# Patient Record
Sex: Female | Born: 2000 | Race: White | Hispanic: No | Marital: Single | Smoking: Never smoker
Health system: Southern US, Community
[De-identification: ages and names within clinical notes are randomized; demographics above are authoritative.]

## PROBLEM LIST (undated history)

## (undated) DIAGNOSIS — O139 Gestational [pregnancy-induced] hypertension without significant proteinuria, unspecified trimester: Secondary | ICD-10-CM

## (undated) DIAGNOSIS — F909 Attention-deficit hyperactivity disorder, unspecified type: Secondary | ICD-10-CM

## (undated) DIAGNOSIS — F913 Oppositional defiant disorder: Secondary | ICD-10-CM

## (undated) DIAGNOSIS — F419 Anxiety disorder, unspecified: Secondary | ICD-10-CM

## (undated) HISTORY — DX: Gestational (pregnancy-induced) hypertension without significant proteinuria, unspecified trimester: O13.9

## (undated) HISTORY — DX: Oppositional defiant disorder: F91.3

## (undated) HISTORY — PX: MOUTH SURGERY: SHX715

## (undated) HISTORY — DX: Anxiety disorder, unspecified: F41.9

## (undated) HISTORY — DX: Attention-deficit hyperactivity disorder, unspecified type: F90.9

---
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
## 2021-04-09 ENCOUNTER — Emergency Department (HOSPITAL_COMMUNITY): Payer: 59

## 2021-04-09 ENCOUNTER — Emergency Department (HOSPITAL_COMMUNITY)
Admission: EM | Admit: 2021-04-09 | Discharge: 2021-04-09 | Disposition: A | Discharge status: Home / Self Care | Payer: 59 | Attending: Emergency Medicine | Admitting: Emergency Medicine

## 2021-04-09 DIAGNOSIS — D72829 Elevated white blood cell count, unspecified: Secondary | ICD-10-CM | POA: Diagnosis not present

## 2021-04-09 DIAGNOSIS — Z20822 Contact with and (suspected) exposure to covid-19: Secondary | ICD-10-CM | POA: Insufficient documentation

## 2021-04-09 DIAGNOSIS — N898 Other specified noninflammatory disorders of vagina: Secondary | ICD-10-CM | POA: Insufficient documentation

## 2021-04-09 DIAGNOSIS — R1084 Generalized abdominal pain: Secondary | ICD-10-CM

## 2021-04-09 DIAGNOSIS — R102 Pelvic and perineal pain: Secondary | ICD-10-CM | POA: Diagnosis not present

## 2021-04-09 LAB — URINALYSIS, ROUTINE W REFLEX MICROSCOPIC
Bilirubin Urine: NEGATIVE
Glucose, UA: NEGATIVE mg/dL
Hgb urine dipstick: NEGATIVE
Ketones, ur: 5 mg/dL — AB
Nitrite: NEGATIVE
Protein, ur: NEGATIVE mg/dL
Specific Gravity, Urine: >1.046 — ABNORMAL HIGH (ref 1.005–1.030)
pH: 7 (ref 5.0–8.0)

## 2021-04-09 LAB — CBC
HCT: 44 % (ref 36.0–46.0)
Hemoglobin: 14.7 g/dL (ref 12.0–15.0)
MCH: 30.9 pg (ref 26.0–34.0)
MCHC: 33.4 g/dL (ref 30.0–36.0)
MCV: 92.4 fL (ref 80.0–100.0)
Platelets: 247 10*3/uL (ref 150–400)
RBC: 4.76 MIL/uL (ref 3.87–5.11)
RDW: 11.8 % (ref 11.5–15.5)
WBC: 16.4 10*3/uL — ABNORMAL HIGH (ref 4.0–10.5)
nRBC: 0 % (ref 0.0–0.2)

## 2021-04-09 LAB — COMPREHENSIVE METABOLIC PANEL WITH GFR
ALT: 15 U/L (ref 0–44)
AST: 15 U/L (ref 15–41)
Albumin: 4.4 g/dL (ref 3.5–5.0)
Alkaline Phosphatase: 56 U/L (ref 38–126)
Anion gap: 9 (ref 5–15)
BUN: 9 mg/dL (ref 6–20)
CO2: 25 mmol/L (ref 22–32)
Calcium: 9.5 mg/dL (ref 8.9–10.3)
Chloride: 102 mmol/L (ref 98–111)
Creatinine, Ser: 0.65 mg/dL (ref 0.44–1.00)
GFR, Estimated: >60 mL/min
Glucose, Bld: 102 mg/dL — ABNORMAL HIGH (ref 70–99)
Potassium: 4.1 mmol/L (ref 3.5–5.1)
Sodium: 136 mmol/L (ref 135–145)
Total Bilirubin: 1.3 mg/dL — ABNORMAL HIGH (ref 0.3–1.2)
Total Protein: 7 g/dL (ref 6.5–8.1)

## 2021-04-09 LAB — I-STAT BETA HCG BLOOD, ED (MC, WL, AP ONLY): I-stat hCG, quantitative: <5 m[IU]/mL (ref <5)

## 2021-04-09 LAB — RESP PANEL BY RT-PCR (FLU A&B, COVID) ARPGX2
Influenza A by PCR: NEGATIVE
Influenza B by PCR: NEGATIVE
SARS Coronavirus 2 by RT PCR: NEGATIVE

## 2021-04-09 LAB — WET PREP, GENITAL
Sperm: NONE SEEN
Trich, Wet Prep: NONE SEEN
WBC, Wet Prep HPF POC: >=10 — AB (ref <10)
Yeast Wet Prep HPF POC: NONE SEEN

## 2021-04-09 LAB — LIPASE, BLOOD: Lipase: 25 U/L (ref 11–51)

## 2021-04-09 LAB — RAPID HIV SCREEN (HIV 1/2 AB+AG)
HIV 1/2 Antibodies: NONREACTIVE
HIV-1 P24 Antigen - HIV24: NONREACTIVE

## 2021-04-09 MED ORDER — DICYCLOMINE HCL 20 MG PO TABS: 20.0000 mg | ORAL_TABLET | Freq: Two times a day (BID) | ORAL | 0 refills | Status: DC | PRN

## 2021-04-09 MED ORDER — IOHEXOL 300 MG/ML  SOLN
100.0000 mL | Freq: Once | INTRAMUSCULAR | Status: AC | PRN
  Administered 2021-04-09: 100 mL via INTRAVENOUS

## 2021-04-09 MED ORDER — METRONIDAZOLE 500 MG PO TABS: 500.0000 mg | ORAL_TABLET | Freq: Two times a day (BID) | ORAL | 0 refills | Status: AC

## 2021-04-09 MED ORDER — DICYCLOMINE HCL 10 MG PO CAPS
10.0000 mg | ORAL_CAPSULE | Freq: Once | ORAL | Status: AC
  Administered 2021-04-09: 10 mg via ORAL
  Filled 2021-04-09: qty 1

## 2021-04-09 MED ORDER — ONDANSETRON HCL 4 MG/2ML IJ SOLN
4.0000 mg | Freq: Once | INTRAMUSCULAR | Status: AC
  Administered 2021-04-09: 4 mg via INTRAVENOUS
  Filled 2021-04-09: qty 2

## 2021-04-09 MED ORDER — ONDANSETRON 4 MG PO TBDP: 4.0000 mg | ORAL_TABLET | Freq: Three times a day (TID) | ORAL | 0 refills | Status: DC | PRN

## 2021-04-09 MED ORDER — OXYCODONE-ACETAMINOPHEN 5-325 MG PO TABS: 1.0000 | ORAL_TABLET | Freq: Once | ORAL | Status: DC

## 2021-04-09 MED ORDER — DEXTROSE 5 % IV SOLN
500.0000 mg | Freq: Once | INTRAVENOUS | Status: AC
  Administered 2021-04-09: 500 mg via INTRAVENOUS
  Filled 2021-04-09: qty 500

## 2021-04-09 MED ORDER — MORPHINE SULFATE (PF) 2 MG/ML IV SOLN
2.0000 mg | Freq: Once | INTRAVENOUS | Status: AC
  Administered 2021-04-09: 2 mg via INTRAVENOUS
  Filled 2021-04-09: qty 1

## 2021-04-09 MED ORDER — DOXYCYCLINE HYCLATE 100 MG PO CAPS: 100.0000 mg | ORAL_CAPSULE | Freq: Two times a day (BID) | ORAL | 0 refills | Status: AC

## 2021-04-09 MED ORDER — MORPHINE SULFATE (PF) 4 MG/ML IV SOLN
4.0000 mg | Freq: Once | INTRAVENOUS | Status: AC
  Administered 2021-04-09: 4 mg via INTRAVENOUS
  Filled 2021-04-09: qty 1

## 2021-04-09 NOTE — ED Provider Notes (Signed)
Emergency Medicine Provider Triage Evaluation Note  Norma Aguilar , a 20 y.o. female  was evaluated in triage.  Pt complains of right lower quadrant pain that started yesterday and is progressively worsened since that time.  10 out of 10 at this time.  Patient is tearful.  She now also has left-sided lower abdominal pain.  Decreased urinary output though sensation of urgency.  LMP since last sexual encounter with a female. Review of Systems  Positive: Abdominal pain, chills, decreased urine output, urinary urgency Negative: Urinary frequency, vaginal bleeding or discharge, chest pain visual to breathing  Physical Exam  BP (!) 133/104 (BP Location: Right Arm)   Pulse (!) 104   Temp 98.4 F (36.9 C) (Oral)   Resp 16   LMP 03/20/2021   SpO2 100%  Gen:   Awake, tearful secondary to pain Resp:  Normal effort  MSK:   Moves extremities without difficulty  Other:  Tenderness palpation in the left lower quadrant and exquisite tenderness palpation in the right lower quadrant without guarding or rebound.  Medical Decision Making  Medically screening exam initiated at 8:14 AM.  Appropriate orders placed.  Norma Vannote was informed that the remainder of the evaluation will be completed by another provider, this initial triage assessment does not replace that evaluation, and the importance of remaining in the ED until their evaluation is complete.  This chart was dictated using voice recognition software, Dragon. Despite the best efforts of this provider to proofread and correct errors, errors may still occur which can change documentation meaning.    Paris Lore, PA-C 04/09/21 0836    Melene Plan, DO 04/10/21 [phone]

## 2021-04-09 NOTE — ED Triage Notes (Signed)
Pt. Stated, My stomach started hurting last night , lower and it hurts to do anything.

## 2021-04-09 NOTE — ED Provider Notes (Signed)
[facility] EMERGENCY DEPARTMENT Provider Note   CSN: [id number] Arrival date & time: 04/09/21  0736     History Chief Complaint  Patient presents with   Abdominal Pain    Norma Aguilar is a 20 y.o. female who presents with concern for right-sided lower abdominal pain that started yesterday evening and has been constant and gradually worsening since that time.  10 out of 10 when she moves, 6 out of 10 when lying still.  Endorses transient nausea but no vomiting, diarrhea, fevers.  She does endorse chills.  States that she recently recovered from an upper respiratory virus.  She does state that she has been having some vaginal discharge.  Was recently diagnosed with bacterial vaginosis but did not ever pick up the antibiotic.  States that the odor improved after vaginal boric acid suppository but discharge did not change.  States that she is not been sexually active for several months and when she is sexually active it is with female partners with whom she uses condoms a majority of the time.  History of chlamydia infection which was treated.  I personally reviewed this patient's medical recWrist.  She has history of anxiety, ODD as a child. LMP 2 weeks ago.   HPI     Past Medical History:  Diagnosis Date   ADHD (attention deficit hyperactivity disorder)    ADHD (attention deficit hyperactivity disorder)    Anxiety    Oppositional defiant disorder     Patient Active Problem List   Diagnosis Date Noted   Obesity with body mass index (BMI) in 95th to 98th percentile for age in pediatric patient 05/08/2017   Suicidal ideation 05/02/2017   Panic attacks 05/01/2017   Abnormal biliary HIDA scan 02/04/2017   Weight gain 02/04/2017   RUQ abdominal pain 02/04/2017   Regurgitation of food 02/04/2017   Heartburn 02/04/2017   Non-intractable vomiting with nausea 02/04/2017   Defecation urgency 02/04/2017   Major depressive disorder, recurrent episode ([place]) 01/12/2014    ADHD (attention deficit hyperactivity disorder), combined type 08/05/2011   ODD (oppositional defiant disorder) 08/05/2011   Generalized anxiety disorder 08/05/2011    Past Surgical History:  Procedure Laterality Date   MOUTH SURGERY       OB History   No obstetric history on file.     Family History  Problem Relation Age of Onset   Bipolar disorder Father    Stroke Father 13   Liver disease Father    COPD Paternal Grandmother    Diabetes Paternal Grandfather    Heart attack Paternal Grandfather    Gallbladder disease Paternal Grandfather    ADD / ADHD Cousin    Gallbladder disease Paternal Uncle     Social History   Tobacco Use   Smoking status: Never   Smokeless tobacco: Never  Vaping Use   Vaping Use: Every day  Substance Use Topics   Alcohol use: No    Alcohol/week: 0.0 standard drinks   Drug use: No    Home Medications Prior to Admission medications   Medication Sig Start Date End Date Taking? Authorizing Provider  dicyclomine (BENTYL) 20 MG tablet Take 1 tablet (20 mg total) by mouth 2 (two) times daily as needed for spasms. 04/09/21  Yes Noora Locascio R, PA-C  doxycycline (VIBRAMYCIN) 100 MG capsule Take 1 capsule (100 mg total) by mouth 2 (two) times daily for 7 days. 04/09/21 04/16/21 Yes Verlie Liotta, Eugene Garnet R, PA-C  metroNIDAZOLE (FLAGYL) 500 MG tablet Take 1 tablet (500  mg total) by mouth 2 (two) times daily for 7 days. 04/09/21 04/16/21 Yes Katlin Bortner R, PA-C  ondansetron (ZOFRAN-ODT) 4 MG disintegrating tablet Take 1 tablet (4 mg total) by mouth every 8 (eight) hours as needed for nausea or vomiting. 04/09/21  Yes Nickisha Hum, Gypsy Balsam, PA-C  meclizine (ANTIVERT) 25 MG tablet Take 1 tablet (25 mg total) by mouth 3 (three) times daily as needed for dizziness. 11/04/18   Hayden Rasmussen, MD  mirtazapine (REMERON) 30 MG tablet 1 QHS 07/17/17   Hampton Abbot, MD    Allergies    Phenergan [promethazine hcl], Reglan [metoclopramide], and Lexapro  [escitalopram oxalate]  Review of Systems   Review of Systems  Constitutional:  Positive for appetite change and chills. Negative for activity change, diaphoresis, fatigue and fever.  HENT: Negative.    Respiratory: Negative.    Cardiovascular: Negative.   Gastrointestinal:  Positive for abdominal pain and nausea. Negative for blood in stool, constipation, diarrhea and vomiting.  Genitourinary:  Positive for decreased urine volume, urgency and vaginal discharge. Negative for dyspareunia, dysuria, enuresis, flank pain, frequency, genital sores, hematuria, menstrual problem, pelvic pain, vaginal bleeding and vaginal pain.  Musculoskeletal: Negative.   Skin: Negative.   Neurological: Negative.   Hematological: Negative.    Physical Exam Updated Vital Signs BP 119/76   Pulse 98   Temp 98.4 F (36.9 C) (Oral)   Resp 16   LMP 03/20/2021   SpO2 98%   Physical Exam Vitals and nursing note reviewed. Exam conducted with a chaperone present.  Constitutional:      Appearance: She is ill-appearing. She is not toxic-appearing.     Comments: Tearful secondary to pain.  HENT:     Head: Normocephalic and atraumatic.     Nose: Nose normal.     Mouth/Throat:     Mouth: Mucous membranes are moist.     Pharynx: Oropharynx is clear. Uvula midline. No oropharyngeal exudate or posterior oropharyngeal erythema.     Tonsils: No tonsillar exudate.  Eyes:     General: Lids are normal. Vision grossly intact.        Right eye: No discharge.        Left eye: No discharge.     Extraocular Movements: Extraocular movements intact.     Conjunctiva/sclera: Conjunctivae normal.     Pupils: Pupils are equal, round, and reactive to light.  Neck:     Trachea: Trachea and phonation normal.  Cardiovascular:     Rate and Rhythm: Normal rate and regular rhythm.     Pulses: Normal pulses.     Heart sounds: Normal heart sounds.  Pulmonary:     Effort: Pulmonary effort is normal. No tachypnea, bradypnea,  accessory muscle usage, prolonged expiration or respiratory distress.     Breath sounds: Normal breath sounds. No wheezing or rales.  Abdominal:     General: Bowel sounds are normal. There is no distension.     Palpations: Abdomen is soft.     Tenderness: There is abdominal tenderness in the right lower quadrant, periumbilical area, suprapubic area and left lower quadrant. There is no right CVA tenderness, left CVA tenderness, guarding or rebound. Positive signs include Rovsing's sign, McBurney's sign and obturator sign. Negative signs include Murphy's sign.     Hernia: There is no hernia in the left inguinal area or right inguinal area.  Genitourinary:    General: Normal vulva.     Exam position: Supine.     Pubic Area: No rash.  Vagina: Vaginal discharge present. No tenderness, bleeding, lesions or prolapsed vaginal walls.     Cervix: Normal.     Uterus: Normal.      Adnexa: Right adnexa normal and left adnexa normal.     Comments: Milky white discharge present in the vaginal vault with foul odor, no cervical motion tenderness or adnexal tenderness bilaterally.  No adnexal fullness. Musculoskeletal:        General: No deformity.     Cervical back: Normal range of motion and neck supple. No edema, rigidity or crepitus. No pain with movement or spinous process tenderness.     Right lower leg: No edema.     Left lower leg: No edema.  Lymphadenopathy:     Cervical: No cervical adenopathy.     Lower Body: No right inguinal adenopathy. No left inguinal adenopathy.  Skin:    General: Skin is warm and dry.     Capillary Refill: Capillary refill takes less than 2 seconds.     Findings: No rash.  Neurological:     Mental Status: She is alert. Mental status is at baseline.     Gait: Gait is intact. Gait normal.  Psychiatric:        Mood and Affect: Mood normal.    ED Results / Procedures / Treatments   Labs (all labs ordered are listed, but only abnormal results are displayed) Labs  Reviewed  WET PREP, GENITAL - Abnormal; Notable for the following components:      Result Value   Clue Cells Wet Prep HPF POC PRESENT (*)    WBC, Wet Prep HPF POC >=10 (*)    All other components within normal limits  COMPREHENSIVE METABOLIC PANEL - Abnormal; Notable for the following components:   Glucose, Bld 102 (*)    Total Bilirubin 1.3 (*)    All other components within normal limits  CBC - Abnormal; Notable for the following components:   WBC 16.4 (*)    All other components within normal limits  URINALYSIS, ROUTINE W REFLEX MICROSCOPIC - Abnormal; Notable for the following components:   APPearance HAZY (*)    Specific Gravity, Urine >1.046 (*)    Ketones, ur 5 (*)    Leukocytes,Ua MODERATE (*)    Bacteria, UA RARE (*)    All other components within normal limits  RESP PANEL BY RT-PCR (FLU A&B, COVID) ARPGX2  LIPASE, BLOOD  RPR  RAPID HIV SCREEN (HIV 1/2 AB+AG)  I-STAT BETA HCG BLOOD, ED ([facility], [facility], [facility] ONLY)  GC/CHLAMYDIA PROBE AMP ([place]) NOT AT [facility]    EKG None  Radiology CT Abdomen Pelvis W Contrast  Result Date: 04/09/2021 CLINICAL DATA:  Right lower quadrant pain. EXAM: CT ABDOMEN AND PELVIS WITH CONTRAST TECHNIQUE: Multidetector CT imaging of the abdomen and pelvis was performed using the standard protocol following bolus administration of intravenous contrast. CONTRAST:  OMNIPAQUE IOHEXOL 300 MG/ML  SOLN COMPARISON:  Report of CT abdomen pelvis 01/18/2017 is reviewed. Images are not available at the time of dictation. FINDINGS: Lower chest: Nodularity in anterior and lateral left lower lobe, likely benign in a patient of this age, possibly postinfectious in etiology. Heart size normal. No pericardial effusion. Hepatobiliary: Liver and gallbladder are unremarkable. No biliary ductal dilatation. Pancreas: Negative. Spleen: Negative. Adrenals/Urinary Tract: Adrenal glands and kidneys are unremarkable. Early excretion of contrast from both kidneys. Ureters are  decompressed. Bladder is grossly unremarkable. Stomach/Bowel: Stomach, small bowel, appendix and colon are unremarkable. Vascular/Lymphatic: Vascular structures are unremarkable. No pathologically  enlarged lymph nodes. Mesenteric lymph nodes are subcentimeter in short axis size. Reproductive: Uterus is visualized.  No adnexal mass. Other: No free fluid.  Mesenteries and peritoneum are unremarkable. Musculoskeletal: Bone island in the left ischium. No worrisome lytic or sclerotic lesions. No worrisome lytic or sclerotic lesions. IMPRESSION: No evidence of appendicitis. No findings to explain the patient's pain. Electronically Signed   By: Leanna BattlesMelinda  Blietz M.D.   On: 04/09/2021 11:40   US PELVIC COMPLETE W TRANSVAGINAL AND TORSION R/O  Result Date: 04/09/2021 CLINICAL DATA:  Acute pelvic pain. EXAM: TRANSABDOMINAL AND TRANSVAGINAL ULTRASOUND OF PELVIS DOPPLER ULTRASOUND OF OVARIES TECHNIQUE: Both transabdominal and transvaginal ultrasound examinations of the pelvis were performed. Transabdominal technique was performed for global imaging of the pelvis including uterus, ovaries, adnexal regions, and pelvic cul-de-sac. It was necessary to proceed with endovaginal exam following the transabdominal exam to visualize the endometrium and ovaries. Color and duplex Doppler ultrasound was utilized to evaluate blood flow to the ovaries. COMPARISON:  CT of same day. FINDINGS: Uterus Measurements: 6.7 x 4.4 x 3.9 cm = volume: 61 mL. Nabothian cysts are seen in the cervix. Endometrium Thickness: 7 mm which is within normal limits. No focal abnormality visualized. Right ovary Measurements: 4.5 x 3.2 x 2.9 cm = volume: 22 mL. Normal appearance/no adnexal mass. Left ovary Measurements: 4.5 x 2.6 x 1.9 cm = volume: 11 mL. Normal appearance/no adnexal mass. Pulsed Doppler evaluation of both ovaries demonstrates normal low-resistance arterial and venous waveforms. Other findings No abnormal free fluid. IMPRESSION: No significant  abnormality seen in the pelvis. Electronically Signed   By: Lupita RaiderJames  Green Jr M.D.   On: 04/09/2021 13:54    Procedures Procedures   Medications Ordered in ED Medications  cefTRIAXone (ROCEPHIN) 500 mg in dextrose 5 % 50 mL IVPB (has no administration in time range)  iohexol (OMNIPAQUE) 300 MG/ML solution 100 mL (100 mLs Intravenous Contrast Given 04/09/21 1128)  morphine 4 MG/ML injection 4 mg (4 mg Intravenous Given 04/09/21 1223)  ondansetron (ZOFRAN) injection 4 mg (4 mg Intravenous Given 04/09/21 1224)  morphine 2 MG/ML injection 2 mg (2 mg Intravenous Given 04/09/21 1355)  dicyclomine (BENTYL) capsule 10 mg (10 mg Oral Given 04/09/21 1443)    ED Course  I have reviewed the triage vital signs and the nursing notes.  Pertinent labs & imaging results that were available during my care of the patient were reviewed by me and considered in my medical decision making (see chart for details).    MDM Rules/Calculators/A&P                         20 year old female who presents with concern for Right lower belly pain since yesterday.   The differential diagnosis for RLQ includes but is not limited to:  Cholelithiasis / choledocholithiasis / cholecystitis / cholangitis, hepatitis (eg. viral, alcoholic, toxic),liver abscess, pancreatitis, liver / pancreatic / biliary tract cancer, ischemic hepatopathy (shock liver), hepatic vein obstruction (Budd-Chiari syndrome), liver cell adenoma, peptic ulcer disease (duodenal), functional or nonulcer dyspepsia, pyelonephritis, urinary calculi,  Fitz-Hugh-Curtis syndrome (with pelvic inflammatory disease), herpes zoster, trauma or musculoskeletal pain, herniated disk, abdominal abscess, intestinal ischemia, physical or sexual abuse, ectopic pregnancy, IUP, Mittelschmerz, ovarian cyst/torsion, threatened/ievitable abortion, PID, endometriosis, molar pregnancy, heterotopic pregnancy, corpus luteum cyst, appendicitis, UTI/renal colic, IBD.   Hypertensive and mildly  tachycardic on intake.  Cardiopulmonary exam is normal at time of my evaluation.  Abdominal exam with tenderness McBurney's point, positive Rovsing  sign, positive obturator sign, no CVA tenderness and no pelvic tenderness to palpation.  GU exam performed with chaperone with milky white discharge in the vaginal vault but no CMT or adnexal fullness/TTP.  Patient remains tearful secondary to pain.  Morphine and Zofran administered.  CBC with leukocytosis of 16,000, CMP with very mild elevation in total bili 1.3.  UA without significant findings to suggest infection.  Patient is not pregnant.  Wet prep with clue cells without trichomonas.  GC/chlamydia, RPR and HIV pending.  CT of the abdomen pelvis obtained from triage due to concern for possible appendicitis.  This was negative for acute abdominopelvic abnormality.  Pelvic ultrasound with Doppler pending at this time.  Pelvic ultrasound is negative; well appearing ovaries without any clear change in the pelvis to explain patient's symptoms.  While the exact etiology of this patient's symptoms remains unclear there is not appear to be any emergent mechanism at this time.  Suspect possible viral etiology verus early PID without changes detectable on today's imaging. Shared decision-making with the patient regarding the role for presumptive treatment for GC and chlamydia versus waiting for test results to come back in 24 to 72 hours.  Patient expressed understanding of her treatment options and voiced wishes to proceed with presumptive therapy at this time.  Rocephin ordered.  Will discharge with Flagyl for BV and doxycycline for chlamydial coverage.  We will also discharge with Bentyl and Zofran as needed for symptoms.  Suspect possible viral etiology for her discomfort.  May follow-up in the outpatient setting.  No further work-up warranted in the ED at this time.  Norma voiced understanding of her medical evaluation and treatment plan.  Each of her  questions was answered to her expressed satisfaction.  Return precautions were given.  Patient is tolerating PO, stable, and appropriate for discharge at this time.  This chart was dictated using voice recognition software, Dragon. Despite the best efforts of this provider to proofread and correct errors, errors may still occur which can change documentation meaning.  Final Clinical Impression(s) / ED Diagnoses Final diagnoses:  Pelvic pain    Rx / DC Orders ED Discharge Orders          Ordered    metroNIDAZOLE (FLAGYL) 500 MG tablet  2 times daily        04/09/21 1441    doxycycline (VIBRAMYCIN) 100 MG capsule  2 times daily        04/09/21 1441    dicyclomine (BENTYL) 20 MG tablet  2 times daily PRN        04/09/21 1443    ondansetron (ZOFRAN-ODT) 4 MG disintegrating tablet  Every 8 hours PRN        04/09/21 1443             Bernhard Koskinen, Gypsy Balsam, PA-C 04/09/21 [place], [place], DO 04/10/21 [phone]

## 2021-04-09 NOTE — ED Notes (Signed)
Pt transported to Ultrasound.  

## 2021-04-09 NOTE — Discharge Instructions (Addendum)
You were seen in the ER today for your abdominal pain. Your physical exam vital signs, blood work, CT scan, and ultrasound were very reassuring.  While exact cause of your pain remains unclear there does not appear to be any dangerous cause at this time.  You have been prescribed as needed nausea medication called Zofran and medication to take as needed for abdominal cramping called Bentyl. Additionally you were diagnosed with bacterial vaginosis and has been prescribed Flagyl which is an antibiotic to treat.  You have also been prescribed doxycycline for presumptive treatment of chlamydial infection.  Your test results for gonorrhea and chlamydia are still pending and may follow them in the MyChart account.  Please follow-up in the outpatient setting and return to the ER if you develop any new severe symptoms.

## 2021-04-10 LAB — GC/CHLAMYDIA PROBE AMP (~~LOC~~) NOT AT ARMC
Chlamydia: NEGATIVE
Comment: NEGATIVE
Comment: NORMAL
Neisseria Gonorrhea: POSITIVE — AB

## 2021-04-10 LAB — RPR: RPR Ser Ql: NONREACTIVE

## 2022-05-06 NOTE — L&D Delivery Note (Signed)
OB/GYN Faculty Practice Delivery Note  Norma Aguilar is a 22 y.o. G1P0000 s/p SVD at [redacted]w[redacted]d. She was admitted for IOL 2/2 gHTN.   ROM: 4h 36m with clear fluid GBS Status: Negative/-- (11/11 1600) Maximum Maternal Temperature: 98.30F   Labor Progress: Initial SVE: closed/50/-3. She then progressed to complete.   Delivery Date/Time: 03/25/23 2319 Delivery: Called to room and patient was complete and pushing. Head delivered LOA. Shoulder and body delivered in usual fashion. Infant with spontaneous cry, placed on mother's abdomen, dried and stimulated. Cord clamped x 2 after 1-minute delay, and cut by father of baby, Riki Rusk. Cord blood drawn. Placenta delivered spontaneously with gentle cord traction. Fundus firm with massage and Pitocin. Labia, perineum, vagina, and cervix inspected inspected with a small second degree perineal laceration, which was repaired in the usual fashion and bilateral hemostatic periurethral tears, which were not repaired.  Baby Weight: pending  Placenta: Sent to L&D Complications: None Lacerations: Second degree perineal - repaired, bilateral hemostatic periurethral lacerations EBL: 115 mL Analgesia: Epidural   Infant:  APGAR (1 MIN): 9  APGAR (5 MINS): 9   Sundra Aland, MD OB Fellow, Faculty Practice [facility], [facility] for [facility]

## 2022-07-18 ENCOUNTER — Encounter: Payer: Self-pay | Admitting: Adult Health

## 2022-07-18 ENCOUNTER — Other Ambulatory Visit (HOSPITAL_COMMUNITY)
Admission: RE | Admit: 2022-07-18 | Discharge: 2022-07-18 | Disposition: A | Discharge status: Home / Self Care | Payer: 59 | Source: Ambulatory Visit | Attending: Adult Health | Admitting: Adult Health

## 2022-07-18 ENCOUNTER — Ambulatory Visit: Payer: 59 | Admitting: Adult Health

## 2022-07-18 VITALS — BP 123/79 | HR 83 | Ht 61.0 in | Wt 136.0 lb

## 2022-07-18 DIAGNOSIS — Z01419 Encounter for gynecological examination (general) (routine) without abnormal findings: Secondary | ICD-10-CM | POA: Insufficient documentation

## 2022-07-18 DIAGNOSIS — N926 Irregular menstruation, unspecified: Secondary | ICD-10-CM | POA: Diagnosis not present

## 2022-07-18 DIAGNOSIS — Z319 Encounter for procreative management, unspecified: Secondary | ICD-10-CM | POA: Diagnosis not present

## 2022-07-18 DIAGNOSIS — L680 Hirsutism: Secondary | ICD-10-CM

## 2022-07-18 DIAGNOSIS — Z13220 Encounter for screening for lipoid disorders: Secondary | ICD-10-CM

## 2022-07-18 MED ORDER — CITRANATAL 90 DHA 90-1 & 300 MG PO MISC: ORAL | 12 refills | Status: AC

## 2022-07-18 NOTE — Progress Notes (Signed)
Patient ID: Norma Aguilar, female   DOB: 23-Jun-2000, 23 y.o.   MRN: [id number] History of Present Illness: Norma is a 22 year old white female, with SO, G0P0, in for well woman gyn exam and pap. She wants to get pregnant. Periods are irregular and she has facial hair. Was told in past had PCOs. Periods are painful at times.  Had [place] in 2022 that showed normal ovaries.     Current Medications, Allergies, Past Medical History, Past Surgical History, Family History and Social History were reviewed in [employer] record.     Review of Systems: Patient denies any headaches, hearing loss, fatigue, blurred vision, shortness of breath, chest pain, abdominal pain, problems with bowel movements, urination, or intercourse. No joint pain or mood swings.  See HPI for positives.   Physical Exam:BP 123/79 (BP Location: Right Arm, Patient Position: Sitting, Cuff Size: Normal)   Pulse 83   Ht '5\' 1"'$  (1.549 m)   Wt 136 lb (61.7 kg)   LMP 06/24/2022 (Approximate)   BMI 25.70 kg/m   General:  Well developed, well nourished, no acute distress Skin:  Warm and dry Neck:  Midline trachea, normal thyroid, good ROM, no lymphadenopathy Lungs; Clear to auscultation bilaterally Breast:  No dominant palpable mass, retraction, or nipple discharge,has bilateral nipple rods  Cardiovascular: Regular rate and rhythm Abdomen:  Soft, non tender, no hepatosplenomegaly Pelvic:  External genitalia is normal in appearance, no lesions.  The vagina is normal in appearance. Urethra has no lesions or masses. The cervix is smooth, pap with GC/CHL performed.  Uterus is felt to be normal size, shape, and contour.  No adnexal masses or tenderness noted.Bladder is non tender, no masses felt. Extremities/musculoskeletal:  No swelling or varicosities noted, no clubbing or cyanosis Psych:  No mood changes, alert and cooperative,seems happy AA is 4 Fall risk is low    07/18/2022   10:22 AM 12/07/2015    1:48 PM  01/19/2014    2:01 PM  Depression screen PHQ 2/9  Decreased Interest 0    Down, Depressed, Hopeless 0    PHQ - 2 Score 0    Altered sleeping 0    Tired, decreased energy 0    Change in appetite 0    Feeling bad or failure about yourself  0    Trouble concentrating 0    Moving slowly or fidgety/restless 0    Suicidal thoughts 0    PHQ-9 Score 0       Information is confidential and restricted. Go to Review Flowsheets to unlock data.       07/18/2022   10:22 AM 12/07/2015    1:50 PM  GAD 7 : Generalized Anxiety Score  Nervous, Anxious, on Edge 1   Control/stop worrying 1   Worry too much - different things 0   Trouble relaxing 0   Restless 0   Easily annoyed or irritable 0   Afraid - awful might happen 0   Total GAD 7 Score 2   Anxiety Difficulty       Information is confidential and restricted. Go to Review Flowsheets to unlock data.      Upstream - 07/18/22 1006       Pregnancy Intention Screening   Does the patient want to become pregnant in the next year? Yes    Does the patient's partner want to become pregnant in the next year? Yes    Would the patient like to discuss contraceptive options today? No  Contraception Wrap Up   Current Method Pregnant/Seeking Pregnancy    End Method Pregnant/Seeking Pregnancy             Examination chaperoned by Levy Pupa LPN.   Impression and Plan: 1. Encounter for gynecological examination with Papanicolaou smear of cervix Pap sent Pap in 3 years if normal Physical in 1 year Will talk when labs back  - Cytology - PAP( [facility]) - CBC - Comprehensive metabolic panel - Lipid panel  2. Patient desires pregnancy Start PNV Meds ordered this encounter  Medications   Prenat w/o [id number] (CITRANATAL 90 DHA) 90-1 & 300 MG MISC    Sig: Take as directed    Dispense:  60 each    Refill:  12    Order Specific Question:   Supervising Provider    Answer:   Florian Buff [[id number]]   Discussed timing of sex    3. Irregular periods Periods are not regular  - TSH + free T4 - Insulin, random  4. Hirsutism +increased facial hair, top lip and chin - Testosterone,Free and Total  5. Screening cholesterol level - Lipid panel

## 2022-07-19 LAB — CYTOLOGY - PAP
Chlamydia: NEGATIVE
Comment: NEGATIVE
Comment: NORMAL
Diagnosis: NEGATIVE
Neisseria Gonorrhea: NEGATIVE

## 2022-07-20 LAB — COMPREHENSIVE METABOLIC PANEL
ALT: 15 IU/L (ref 0–32)
AST: 19 IU/L (ref 0–40)
Albumin/Globulin Ratio: 2.7 — ABNORMAL HIGH (ref 1.2–2.2)
Albumin: 5.3 g/dL — ABNORMAL HIGH (ref 4.0–5.0)
Alkaline Phosphatase: 59 IU/L (ref 44–121)
BUN/Creatinine Ratio: 17 (ref 9–23)
BUN: 12 mg/dL (ref 6–20)
Bilirubin Total: 0.4 mg/dL (ref 0.0–1.2)
CO2: 21 mmol/L (ref 20–29)
Calcium: 9.7 mg/dL (ref 8.7–10.2)
Chloride: 100 mmol/L (ref 96–106)
Creatinine, Ser: 0.71 mg/dL (ref 0.57–1.00)
Globulin, Total: 2 g/dL (ref 1.5–4.5)
Glucose: 84 mg/dL (ref 70–99)
Potassium: 4.5 mmol/L (ref 3.5–5.2)
Sodium: 139 mmol/L (ref 134–144)
Total Protein: 7.3 g/dL (ref 6.0–8.5)
eGFR: 122 mL/min/{1.73_m2} (ref >59)

## 2022-07-20 LAB — CBC
Hematocrit: 46.7 % — ABNORMAL HIGH (ref 34.0–46.6)
Hemoglobin: 15.6 g/dL (ref 11.1–15.9)
MCH: 31.4 pg (ref 26.6–33.0)
MCHC: 33.4 g/dL (ref 31.5–35.7)
MCV: 94 fL (ref 79–97)
Platelets: 229 10*3/uL (ref 150–450)
RBC: 4.97 x10E6/uL (ref 3.77–5.28)
RDW: 11.8 % (ref 11.7–15.4)
WBC: 7.4 10*3/uL (ref 3.4–10.8)

## 2022-07-20 LAB — LIPID PANEL
Chol/HDL Ratio: 2.1 ratio (ref 0.0–4.4)
Cholesterol, Total: 175 mg/dL (ref 100–199)
HDL: 85 mg/dL (ref >39)
LDL Chol Calc (NIH): 82 mg/dL (ref 0–99)
Triglycerides: 37 mg/dL (ref 0–149)
VLDL Cholesterol Cal: 8 mg/dL (ref 5–40)

## 2022-07-20 LAB — TESTOSTERONE,FREE AND TOTAL
Testosterone, Free: 4.2 pg/mL (ref 0.0–4.2)
Testosterone: 46 ng/dL (ref 13–71)

## 2022-07-20 LAB — TSH+FREE T4
Free T4: 1.58 ng/dL (ref 0.82–1.77)
TSH: 1.03 u[IU]/mL (ref 0.450–4.500)

## 2022-07-20 LAB — INSULIN, RANDOM: INSULIN: 8.2 u[IU]/mL (ref 2.6–24.9)

## 2022-08-13 ENCOUNTER — Other Ambulatory Visit: Payer: 59

## 2022-08-15 ENCOUNTER — Ambulatory Visit (INDEPENDENT_AMBULATORY_CARE_PROVIDER_SITE_OTHER): Payer: 59 | Admitting: *Deleted

## 2022-08-15 ENCOUNTER — Encounter: Payer: Self-pay | Admitting: *Deleted

## 2022-08-15 DIAGNOSIS — Z3201 Encounter for pregnancy test, result positive: Secondary | ICD-10-CM | POA: Diagnosis not present

## 2022-08-15 LAB — POCT URINE PREGNANCY: Preg Test, Ur: POSITIVE — AB

## 2022-08-15 NOTE — Progress Notes (Signed)
   NURSE VISIT- PREGNANCY CONFIRMATION   SUBJECTIVE:  Norma Aguilar is a 22 y.o. G1P0000 female at [redacted]w[redacted]d by certain LMP of Patient's last menstrual period was 06/24/2022 (approximate). Here for pregnancy confirmation.  Home pregnancy test: positive x 3   She reports no complaints.  She is not taking prenatal vitamins.    OBJECTIVE:  LMP 06/24/2022 (Approximate)   Appears well, in no apparent distress  Results for orders placed or performed in visit on 08/15/22 (from the past 24 hour(s))  POCT urine pregnancy   Collection Time: 08/15/22 10:37 AM  Result Value Ref Range   Preg Test, Ur Positive (A) Negative    ASSESSMENT: Positive pregnancy test, [redacted]w[redacted]d by LMP    PLAN: Schedule for dating ultrasound in 1-2 weeks Prenatal vitamins: plans to begin OTC ASAP   Nausea medicines: not currently needed   OB packet given: Yes  Annamarie Dawley  08/15/2022 10:41 AM

## 2022-08-23 ENCOUNTER — Other Ambulatory Visit: Payer: Self-pay | Admitting: Obstetrics & Gynecology

## 2022-08-23 DIAGNOSIS — O3680X0 Pregnancy with inconclusive fetal viability, not applicable or unspecified: Secondary | ICD-10-CM

## 2022-08-26 ENCOUNTER — Ambulatory Visit (INDEPENDENT_AMBULATORY_CARE_PROVIDER_SITE_OTHER): Payer: 59

## 2022-08-26 DIAGNOSIS — Z3A09 9 weeks gestation of pregnancy: Secondary | ICD-10-CM

## 2022-08-26 DIAGNOSIS — O3680X0 Pregnancy with inconclusive fetal viability, not applicable or unspecified: Secondary | ICD-10-CM

## 2022-08-26 NOTE — Progress Notes (Signed)
[place] 7+2 wks,single IUP with yolk sac,CRL 11.43 mm,FHR 135 bpm,normal ovaries,subchorionic hemorrhage 2.6 x 1.3 x .5 cm

## 2022-09-03 ENCOUNTER — Other Ambulatory Visit: Payer: Self-pay | Admitting: Obstetrics & Gynecology

## 2022-09-03 DIAGNOSIS — Z3682 Encounter for antenatal screening for nuchal translucency: Secondary | ICD-10-CM

## 2022-09-03 NOTE — Progress Notes (Signed)
US order

## 2022-10-01 ENCOUNTER — Encounter: Payer: Self-pay | Admitting: Advanced Practice Midwife

## 2022-10-01 ENCOUNTER — Ambulatory Visit (INDEPENDENT_AMBULATORY_CARE_PROVIDER_SITE_OTHER): Payer: 59

## 2022-10-01 ENCOUNTER — Ambulatory Visit (INDEPENDENT_AMBULATORY_CARE_PROVIDER_SITE_OTHER): Payer: 59 | Admitting: Advanced Practice Midwife

## 2022-10-01 ENCOUNTER — Encounter: Payer: 59 | Admitting: *Deleted

## 2022-10-01 VITALS — BP 127/77 | HR 96 | Wt 148.2 lb

## 2022-10-01 DIAGNOSIS — Z3400 Encounter for supervision of normal first pregnancy, unspecified trimester: Secondary | ICD-10-CM | POA: Insufficient documentation

## 2022-10-01 DIAGNOSIS — Z3402 Encounter for supervision of normal first pregnancy, second trimester: Secondary | ICD-10-CM

## 2022-10-01 DIAGNOSIS — Z363 Encounter for antenatal screening for malformations: Secondary | ICD-10-CM | POA: Diagnosis not present

## 2022-10-01 DIAGNOSIS — Z3A12 12 weeks gestation of pregnancy: Secondary | ICD-10-CM

## 2022-10-01 DIAGNOSIS — O0990 Supervision of high risk pregnancy, unspecified, unspecified trimester: Secondary | ICD-10-CM | POA: Insufficient documentation

## 2022-10-01 DIAGNOSIS — Z3401 Encounter for supervision of normal first pregnancy, first trimester: Secondary | ICD-10-CM | POA: Diagnosis not present

## 2022-10-01 DIAGNOSIS — Z3682 Encounter for antenatal screening for nuchal translucency: Secondary | ICD-10-CM | POA: Diagnosis not present

## 2022-10-01 MED ORDER — LIDOCAINE VISCOUS HCL 2 % MT SOLN: 5.0000 mL | OROMUCOSAL | 0 refills | Status: DC | PRN

## 2022-10-01 NOTE — Progress Notes (Signed)
INITIAL OBSTETRICAL VISIT Patient name: Norma Aguilar MRN [id number]  Date of birth: 12/27/2000 Chief Complaint:   Initial Prenatal Visit (Needs wisdom teeth removed)  History of Present Illness:   Norma Aguilar is a 22 y.o. G64P0000 Caucasian female at [redacted]w[redacted]d by [place] at 7 weeks with an Estimated Date of Delivery: 04/12/23 being seen today for her initial obstetrical visit.   Her obstetrical history is significant for first baby.   Today she reports no complaints. Wisdom teeth are a little painful, oral surgeon around here that's in insurance network won't take them out while pregnant.  May need to find another surgeon if gets too bad.  Will rx lidocaine, may dissolve a baby asa prn.      10/01/2022    3:25 PM 07/18/2022   10:22 AM 12/07/2015    1:48 PM 01/19/2014    2:01 PM  Depression screen PHQ 2/9  Decreased Interest 0 0    Down, Depressed, Hopeless 0 0    PHQ - 2 Score 0 0    Altered sleeping 3 0    Tired, decreased energy 3 0    Change in appetite 3 0    Feeling bad or failure about yourself  0 0    Trouble concentrating 1 0    Moving slowly or fidgety/restless 0 0    Suicidal thoughts 0 0    PHQ-9 Score 10 0       Information is confidential and restricted. Go to Review Flowsheets to unlock data.    Patient's last menstrual period was 06/24/2022 (approximate). Last pap  Diagnosis  Date Value Ref Range Status  07/18/2022   Final   - Negative for intraepithelial lesion or malignancy (NILM)   Review of Systems:   Pertinent items are noted in HPI Denies cramping/contractions, leakage of fluid, vaginal bleeding, abnormal vaginal discharge w/ itching/odor/irritation, headaches, visual changes, shortness of breath, chest pain, abdominal pain, severe nausea/vomiting, or problems with urination or bowel movements unless otherwise stated above.  Pertinent History Reviewed:  Reviewed past medical,surgical, social, obstetrical and family history.  Reviewed problem list, medications and  allergies. OB History  Gravida Para Term Preterm AB Living  1 0 0 0 0 0  SAB IAB Ectopic Multiple Live Births  0 0 0 0 0    # Outcome Date GA Lbr Len/2nd Weight Sex Delivery Anes PTL Lv  1 Current            Physical Assessment:   Vitals:   10/01/22 1523  BP: 127/77  Pulse: 96  Weight: 148 lb 3.2 oz (67.2 kg)  Body mass index is 28 kg/m.       Physical Examination:  General appearance - well appearing, and in no distress  Mental status - alert, oriented to person, place, and time  Psych:  She has a normal mood and affect  Skin - warm and dry, normal color, no suspicious lesions noted  Chest - effort normal  Heart - normal rate and regular rhythm  Abdomen - soft, nontender  Extremities:  No swelling or varicosities noted   TODAY'S NT [place] 12+3 wks,measurements c/w dates,CRL 65.71 mm,NT 1.9 mm,NB present,FHR 160 bpm,normal ovaries   No results found for this or any previous visit (from the past 24 hour(s)).    Two or more of the following: Nulliparity Yes Obesity (body mass index >30 kg/m2) No Family history of preeclampsia in mother or sister No Age ?35 years No Sociodemographic characteristics (African American race, low socioeconomic  level) No Personal risk factors (eg, previous pregnancy with low birth weight or small for gestational age infant, previous adverse pregnancy outcome [eg, stillbirth], interval >10 years between pregnancies) No      10/01/2022    3:25 PM 07/18/2022   10:22 AM 12/07/2015    1:48 PM 01/19/2014    2:01 PM  Depression screen PHQ 2/9  Decreased Interest 0 0    Down, Depressed, Hopeless 0 0    PHQ - 2 Score 0 0    Altered sleeping 3 0    Tired, decreased energy 3 0    Change in appetite 3 0    Feeling bad or failure about yourself  0 0    Trouble concentrating 1 0    Moving slowly or fidgety/restless 0 0    Suicidal thoughts 0 0    PHQ-9 Score 10 0       Information is confidential and restricted. Go to Review Flowsheets to unlock data.         10/01/2022    3:25 PM 07/18/2022   10:22 AM 12/07/2015    1:50 PM  GAD 7 : Generalized Anxiety Score  Nervous, Anxious, on Edge 2 1   Control/stop worrying 1 1   Worry too much - different things 1 0   Trouble relaxing 0 0   Restless 0 0   Easily annoyed or irritable 2 0   Afraid - awful might happen 1 0   Total GAD 7 Score 7 2   Anxiety Difficulty        Information is confidential and restricted. Go to Review Flowsheets to unlock data.      Assessment & Plan:  1) Low-Risk Pregnancy G1P0000 at [redacted]w[redacted]d with an Estimated Date of Delivery: 04/12/23   2) Initial OB visit  3. Anxiety:  offered treatment referral, declined.  Both she and partner feel like its well controlled.      1. Encounter for supervision of normal first pregnancy in first trimester  - Integrated 1 - Urine Culture - CHL AMB BABYSCRIPTS SCHEDULE OPTIMIZATION - CBC/D/Plt+RPR+Rh+ABO+RubIgG... - GC/Chlamydia Probe Amp - PANORAMA PRENATAL TEST FULL PANEL - HORIZON CUSTOM  2. [redacted] weeks gestation of pregnancy  - Integrated 1 - Urine Culture - CHL AMB BABYSCRIPTS SCHEDULE OPTIMIZATION - CBC/D/Plt+RPR+Rh+ABO+RubIgG... - GC/Chlamydia Probe Amp - PANORAMA PRENATAL TEST FULL PANEL - HORIZON CUSTOM  3. Antenatal screening for malformation using ultrasonics  - US OB Comp + 14 Wk; Future       Meds: No orders of the defined types were placed in this encounter.   Initial labs obtained Continue prenatal vitamins Reviewed n/v relief measures and warning s/s to report Reviewed recommended weight gain based on pre-gravid BMI Encouraged well-balanced diet Genetic & carrier screening discussed: requests Panorama, NT/IT, and Horizon , declines AFP Ultrasound discussed; fetal survey: requested CCNC completed> form faxed if has or is planning to apply for medicaid The nature of [facility] [employer] with multiple MDs and other Advanced Practice Providers was explained to patient; also  emphasized that fellows, residents, and students are part of our team. Given a  home bp cuff.. Check bp weekly, let us know if >140/90.        Scarlette Calico Cresenzo-Dishmon 3:45 PM

## 2022-10-01 NOTE — Patient Instructions (Signed)
[place] Such[place], I greatly value your feedback.  If you receive a survey following your visit with [place] today, we appreciate you taking the time to fill it out.  Thanks, Cathie Beams, DNP, CNM  [facility] HAS MOVED!!! It is now [facility] ([address] [place], [place] [address]) Entrance located off of [address] Free 24/7 valet parking   Nausea & Vomiting Have saltine crackers or pretzels by your bed and eat a few bites before you raise your head out of bed in the morning Eat small frequent meals throughout the day instead of large meals Drink plenty of fluids throughout the day to stay hydrated, just don't drink a lot of fluids with your meals.  This can make your stomach fill up faster making you feel sick Do not brush your teeth right after you eat Products with real ginger are good for nausea, like ginger ale and ginger hard candy Make sure it says made with real ginger! Sucking on sour candy like lemon heads is also good for nausea If your prenatal vitamins make you nauseated, take them at night so you will sleep through the nausea Sea Bands If you feel like you need medicine for the nausea & vomiting please let us know If you are unable to keep any fluids or food down please let us know   Constipation Drink plenty of fluid, preferably water, throughout the day Eat foods high in fiber such as fruits, vegetables, and grains Exercise, such as walking, is a good way to keep your bowels regular Drink warm fluids, especially warm prune juice, or decaf coffee Eat a 1/2 cup of real oatmeal (not instant), 1/2 cup applesauce, and 1/2-1 cup warm prune juice every day If needed, you may take Colace (docusate sodium) stool softener once or twice a day to help keep the stool soft.  If you still are having problems with constipation, you may take Miralax once daily as needed to help keep your bowels regular.   Home Blood Pressure Monitoring for  Patients   Your provider has recommended that you check your blood pressure (BP) at least once a week at home. If you do not have a blood pressure cuff at home, one will be provided for you. Contact your provider if you have not received your monitor within 1 week.   Helpful Tips for Accurate Home Blood Pressure Checks  Don't smoke, exercise, or drink caffeine 30 minutes before checking your BP Use the restroom before checking your BP (a full bladder can raise your pressure) Relax in a comfortable upright chair Feet on the ground Left arm resting comfortably on a flat surface at the level of your heart Legs uncrossed Back supported Sit quietly and don't talk Place the cuff on your bare arm Adjust snuggly, so that only two fingertips can fit between your skin and the top of the cuff Check 2 readings separated by at least one minute Keep a log of your BP readings For a visual, please reference this diagram: http://ccnc.care/bpdiagram  Provider Name: [facility]     Phone: [phone]  Zone 1: ALL CLEAR  Continue to monitor your symptoms:  BP reading is less than 140 (top number) or less than 90 (bottom number)  No right upper stomach pain No headaches or seeing spots No feeling nauseated or throwing up No swelling in face and hands  Zone 2: CAUTION Call your doctor's office for any of the following:  BP  reading is greater than 140 (top number) or greater than 90 (bottom number)  Stomach pain under your ribs in the middle or right side Headaches or seeing spots Feeling nauseated or throwing up Swelling in face and hands  Zone 3: EMERGENCY  Seek immediate medical care if you have any of the following:  BP reading is greater than160 (top number) or greater than 110 (bottom number) Severe headaches not improving with Tylenol Serious difficulty catching your breath Any worsening symptoms from Zone 2    First Trimester of Pregnancy The first trimester of pregnancy is from  week 1 until the end of week 12 (months 1 through 3). A week after a sperm fertilizes an egg, the egg will implant on the wall of the uterus. This embryo will begin to develop into a baby. Genes from you and your partner are forming the baby. The female genes determine whether the baby is a boy or a girl. At 6-8 weeks, the eyes and face are formed, and the heartbeat can be seen on ultrasound. At the end of 12 weeks, all the baby's organs are formed.  Now that you are pregnant, you will want to do everything you can to have a healthy baby. Two of the most important things are to get good prenatal care and to follow your health care provider's instructions. Prenatal care is all the medical care you receive before the baby's birth. This care will help prevent, find, and treat any problems during the pregnancy and childbirth. BODY CHANGES Your body goes through many changes during pregnancy. The changes vary from woman to woman.  You may gain or lose a couple of pounds at first. You may feel sick to your stomach (nauseous) and throw up (vomit). If the vomiting is uncontrollable, call your health care provider. You may tire easily. You may develop headaches that can be relieved by medicines approved by your health care provider. You may urinate more often. Painful urination may mean you have a bladder infection. You may develop heartburn as a result of your pregnancy. You may develop constipation because certain hormones are causing the muscles that push waste through your intestines to slow down. You may develop hemorrhoids or swollen, bulging veins (varicose veins). Your breasts may begin to grow larger and become tender. Your nipples may stick out more, and the tissue that surrounds them (areola) may become darker. Your gums may bleed and may be sensitive to brushing and flossing. Dark spots or blotches (chloasma, mask of pregnancy) may develop on your face. This will likely fade after the baby is  born. Your menstrual periods will stop. You may have a loss of appetite. You may develop cravings for certain kinds of food. You may have changes in your emotions from day to day, such as being excited to be pregnant or being concerned that something may go wrong with the pregnancy and baby. You may have more vivid and strange dreams. You may have changes in your hair. These can include thickening of your hair, rapid growth, and changes in texture. Some women also have hair loss during or after pregnancy, or hair that feels dry or thin. Your hair will most likely return to normal after your baby is born. WHAT TO EXPECT AT YOUR PRENATAL VISITS During a routine prenatal visit: You will be weighed to make sure you and the baby are growing normally. Your blood pressure will be taken. Your abdomen will be measured to track your baby's growth. The fetal heartbeat  will be listened to starting around week 10 or 12 of your pregnancy. Test results from any previous visits will be discussed. Your health care provider may ask you: How you are feeling. If you are feeling the baby move. If you have had any abnormal symptoms, such as leaking fluid, bleeding, severe headaches, or abdominal cramping. If you have any questions. Other tests that may be performed during your first trimester include: Blood tests to find your blood type and to check for the presence of any previous infections. They will also be used to check for low iron levels (anemia) and Rh antibodies. Later in the pregnancy, blood tests for diabetes will be done along with other tests if problems develop. Urine tests to check for infections, diabetes, or protein in the urine. An ultrasound to confirm the proper growth and development of the baby. An amniocentesis to check for possible genetic problems. Fetal screens for spina bifida and Down syndrome. You may need other tests to make sure you and the baby are doing well. HOME CARE  INSTRUCTIONS  Medicines Follow your health care provider's instructions regarding medicine use. Specific medicines may be either safe or unsafe to take during pregnancy. Take your prenatal vitamins as directed. If you develop constipation, try taking a stool softener if your health care provider approves. Diet Eat regular, well-balanced meals. Choose a variety of foods, such as meat or vegetable-based protein, fish, milk and low-fat dairy products, vegetables, fruits, and whole grain breads and cereals. Your health care provider will help you determine the amount of weight gain that is right for you. Avoid raw meat and uncooked cheese. These carry germs that can cause birth defects in the baby. Eating four or five small meals rather than three large meals a day may help relieve nausea and vomiting. If you start to feel nauseous, eating a few soda crackers can be helpful. Drinking liquids between meals instead of during meals also seems to help nausea and vomiting. If you develop constipation, eat more high-fiber foods, such as fresh vegetables or fruit and whole grains. Drink enough fluids to keep your urine clear or pale yellow. Activity and Exercise Exercise only as directed by your health care provider. Exercising will help you: Control your weight. Stay in shape. Be prepared for labor and delivery. Experiencing pain or cramping in the lower abdomen or low back is a good sign that you should stop exercising. Check with your health care provider before continuing normal exercises. Try to avoid standing for long periods of time. Move your legs often if you must stand in one place for a long time. Avoid heavy lifting. Wear low-heeled shoes, and practice good posture. You may continue to have sex unless your health care provider directs you otherwise. Relief of Pain or Discomfort Wear a good support bra for breast tenderness.   Take warm sitz baths to soothe any pain or discomfort caused by  hemorrhoids. Use hemorrhoid cream if your health care provider approves.   Rest with your legs elevated if you have leg cramps or low back pain. If you develop varicose veins in your legs, wear support hose. Elevate your feet for 15 minutes, 3-4 times a day. Limit salt in your diet. Prenatal Care Schedule your prenatal visits by the twelfth week of pregnancy. They are usually scheduled monthly at first, then more often in the last 2 months before delivery. Write down your questions. Take them to your prenatal visits. Keep all your prenatal visits as directed by  your health care provider. Safety Wear your seat belt at all times when driving. Make a list of emergency phone numbers, including numbers for family, friends, the hospital, and police and fire departments. General Tips Ask your health care provider for a referral to a local prenatal education class. Begin classes no later than at the beginning of month 6 of your pregnancy. Ask for help if you have counseling or nutritional needs during pregnancy. Your health care provider can offer advice or refer you to specialists for help with various needs. Do not use hot tubs, steam rooms, or saunas. Do not douche or use tampons or scented sanitary pads. Do not cross your legs for long periods of time. Avoid cat litter boxes and soil used by cats. These carry germs that can cause birth defects in the baby and possibly loss of the fetus by miscarriage or stillbirth. Avoid all smoking, herbs, alcohol, and medicines not prescribed by your health care provider. Chemicals in these affect the formation and growth of the baby. Schedule a dentist appointment. At home, brush your teeth with a soft toothbrush and be gentle when you floss. SEEK MEDICAL CARE IF:  You have dizziness. You have mild pelvic cramps, pelvic pressure, or nagging pain in the abdominal area. You have persistent nausea, vomiting, or diarrhea. You have a bad smelling vaginal  discharge. You have pain with urination. You notice increased swelling in your face, hands, legs, or ankles. SEEK IMMEDIATE MEDICAL CARE IF:  You have a fever. You are leaking fluid from your vagina. You have spotting or bleeding from your vagina. You have severe abdominal cramping or pain. You have rapid weight gain or loss. You vomit blood or material that looks like coffee grounds. You are exposed to [place] measles and have never had them. You are exposed to fifth disease or chickenpox. You develop a severe headache. You have shortness of breath. You have any kind of trauma, such as from a fall or a car accident. Document Released: 04/16/2001 Document Revised: 09/06/2013 Document Reviewed: 03/02/2013 [facility] Patient Information 2015 [place], [place]. This information is not intended to replace advice given to you by your health care provider. Make sure you discuss any questions you have with your health care provider.  ADDITIONAL HEALTHCARE OPTIONS FOR PATIENTS  [facility] / e-Visit: [email or web address]         [facility]: [phone]  [facility]: [phone]                   [facility]: [phone]     Safe Medications in Pregnancy   Acne: Benzoyl Peroxide Salicylic Acid  Backache/Headache: Tylenol: 2 regular strength every 4 hours OR              2 Extra strength every 6 hours  Colds/Coughs/Allergies: Benadryl (alcohol free) 25 mg every 6 hours as needed Breath right strips Claritin Cepacol throat lozenges Chloraseptic throat spray Cold-Eeze- up to three times per day Cough drops, alcohol free Flonase (by prescription only) Guaifenesin Mucinex Robitussin DM (plain only, alcohol free) Saline nasal spray/drops Sudafed (pseudoephedrine) & Actifed ** use only after [redacted] weeks gestation and if you do not have high blood pressure Tylenol Vicks Vaporub Zinc  lozenges Zyrtec   Constipation: Colace Ducolax suppositories Fleet enema Glycerin suppositories Metamucil Milk of magnesia Miralax Senokot Smooth move tea  Diarrhea: Kaopectate Imodium A-D  *NO pepto Bismol  Hemorrhoids: Anusol Anusol HC Preparation H Tucks  Indigestion: Tums Maalox Mylanta  Zantac  Pepcid  Insomnia: Benadryl (alcohol free) 25mg  every 6 hours as needed Tylenol PM Unisom, no Gelcaps  Leg Cramps: Tums MagGel  Nausea/Vomiting:  Bonine Dramamine Emetrol Ginger extract Sea bands Meclizine  Nausea medication to take during pregnancy:  Unisom (doxylamine succinate 25 mg tablets) Take one tablet daily at bedtime. If symptoms are not adequately controlled, the dose can be increased to a maximum recommended dose of two tablets daily (1/2 tablet in the morning, 1/2 tablet mid-afternoon and one at bedtime). Vitamin B6 100mg  tablets. Take one tablet twice a day (up to 200 mg per day).  Skin Rashes: Aveeno products Benadryl cream or 25mg  every 6 hours as needed Calamine Lotion 1% cortisone cream  Yeast infection: Gyne-lotrimin 7 Monistat 7   **If taking multiple medications, please check labels to avoid duplicating the same active ingredients **take medication as directed on the label ** Do not exceed 4000 mg of tylenol in 24 hours **Do not take medications that contain aspirin or ibuprofen

## 2022-10-01 NOTE — Progress Notes (Signed)
[place] 12+3 wks,measurements c/w dates,CRL 65.71 mm,NT 1.9 mm,NB present,FHR 160 bpm,normal ovaries

## 2022-10-02 LAB — CBC/D/PLT+RPR+RH+ABO+RUBIGG...
Antibody Screen: NEGATIVE
HIV Screen 4th Generation wRfx: NONREACTIVE
Lymphs: 20 %
Monocytes: 6 %
WBC: 11.8 10*3/uL — ABNORMAL HIGH (ref 3.4–10.8)

## 2022-10-02 LAB — INTEGRATED 1

## 2022-10-03 LAB — INTEGRATED 1
Crown Rump Length: 65.7 mm
Gest. Age on Collection Date: 12.7 weeks
Maternal Age at EDD: 23.8 yr
Nuchal Translucency (NT): 1.9 mm
Number of Fetuses: 1
PAPP-A Value: 1240.3 ng/mL
Weight: 148 [lb_av]

## 2022-10-03 LAB — CBC/D/PLT+RPR+RH+ABO+RUBIGG...
Basophils Absolute: 0 10*3/uL (ref 0.0–0.2)
Basos: 0 %
EOS (ABSOLUTE): 0.1 10*3/uL (ref 0.0–0.4)
Eos: 1 %
HCV Ab: NONREACTIVE
Hematocrit: 42.8 % (ref 34.0–46.6)
Hemoglobin: 14.4 g/dL (ref 11.1–15.9)
Immature Grans (Abs): 0.1 10*3/uL (ref 0.0–0.1)
Immature Granulocytes: 1 %
Lymphocytes Absolute: 2.4 10*3/uL (ref 0.7–3.1)
MCH: 31.3 pg (ref 26.6–33.0)
MCHC: 33.6 g/dL (ref 31.5–35.7)
MCV: 93 fL (ref 79–97)
Monocytes Absolute: 0.7 10*3/uL (ref 0.1–0.9)
Neutrophils Absolute: 8.4 10*3/uL — ABNORMAL HIGH (ref 1.4–7.0)
Neutrophils: 72 %
Platelets: 248 10*3/uL (ref 150–450)
RBC: 4.6 x10E6/uL (ref 3.77–5.28)
RDW: 12.8 % (ref 11.7–15.4)
RPR Ser Ql: NONREACTIVE
Rh Factor: POSITIVE
Rubella Antibodies, IGG: 3.46 index (ref >0.99)

## 2022-10-03 LAB — GC/CHLAMYDIA PROBE AMP
Chlamydia trachomatis, NAA: NEGATIVE
Neisseria Gonorrhoeae by PCR: NEGATIVE

## 2022-10-03 LAB — URINE CULTURE: Organism ID, Bacteria: NO GROWTH

## 2022-10-03 LAB — HCV INTERPRETATION

## 2022-10-08 LAB — PANORAMA PRENATAL TEST FULL PANEL:PANORAMA TEST PLUS 5 ADDITIONAL MICRODELETIONS: FETAL FRACTION: 10.4

## 2022-10-13 LAB — HORIZON CUSTOM: REPORT SUMMARY: NEGATIVE

## 2022-10-29 ENCOUNTER — Encounter: Payer: Self-pay | Admitting: Women's Health

## 2022-10-29 ENCOUNTER — Ambulatory Visit (INDEPENDENT_AMBULATORY_CARE_PROVIDER_SITE_OTHER): Payer: 59 | Admitting: Women's Health

## 2022-10-29 ENCOUNTER — Other Ambulatory Visit: Payer: Self-pay | Admitting: Women's Health

## 2022-10-29 VITALS — BP 118/78 | HR 81 | Wt 159.0 lb

## 2022-10-29 DIAGNOSIS — Z3402 Encounter for supervision of normal first pregnancy, second trimester: Secondary | ICD-10-CM

## 2022-10-29 DIAGNOSIS — Z3401 Encounter for supervision of normal first pregnancy, first trimester: Secondary | ICD-10-CM

## 2022-10-29 DIAGNOSIS — Z1379 Encounter for other screening for genetic and chromosomal anomalies: Secondary | ICD-10-CM

## 2022-10-29 DIAGNOSIS — Z363 Encounter for antenatal screening for malformations: Secondary | ICD-10-CM

## 2022-10-29 DIAGNOSIS — Z3A16 16 weeks gestation of pregnancy: Secondary | ICD-10-CM

## 2022-10-29 DIAGNOSIS — F411 Generalized anxiety disorder: Secondary | ICD-10-CM

## 2022-10-29 NOTE — Progress Notes (Signed)
LOW-RISK PREGNANCY VISIT Patient name: Norma Aguilar MRN [id number]  Date of birth: Jul 13, 2000 Chief Complaint:   Routine Prenatal Visit (2nd IT)  History of Present Illness:   Norma Mcwilliams is a 22 y.o. G10P0000 female at [redacted]w[redacted]d with an Estimated Date of Delivery: 04/12/23 being seen today for ongoing management of a low-risk pregnancy.   Today she reports fatigue, gets up a lot at night to pee, so not sleeping great. Contractions: Not present.  .  Movement: Absent. denies leaking of fluid.     10/01/2022    3:25 PM 07/18/2022   10:22 AM 12/07/2015    1:48 PM 01/19/2014    2:01 PM  Depression screen PHQ 2/9  Decreased Interest 0 0    Down, Depressed, Hopeless 0 0    PHQ - 2 Score 0 0    Altered sleeping 3 0    Tired, decreased energy 3 0    Change in appetite 3 0    Feeling bad or failure about yourself  0 0    Trouble concentrating 1 0    Moving slowly or fidgety/restless 0 0    Suicidal thoughts 0 0    PHQ-9 Score 10 0       Information is confidential and restricted. Go to Review Flowsheets to unlock data.        10/01/2022    3:25 PM 07/18/2022   10:22 AM 12/07/2015    1:50 PM  GAD 7 : Generalized Anxiety Score  Nervous, Anxious, on Edge 2 1   Control/stop worrying 1 1   Worry too much - different things 1 0   Trouble relaxing 0 0   Restless 0 0   Easily annoyed or irritable 2 0   Afraid - awful might happen 1 0   Total GAD 7 Score 7 2   Anxiety Difficulty        Information is confidential and restricted. Go to Review Flowsheets to unlock data.      Review of Systems:   Pertinent items are noted in HPI Denies abnormal vaginal discharge w/ itching/odor/irritation, headaches, visual changes, shortness of breath, chest pain, abdominal pain, severe nausea/vomiting, or problems with urination or bowel movements unless otherwise stated above. Pertinent History Reviewed:  Reviewed past medical,surgical, social, obstetrical and family history.  Reviewed problem list,  medications and allergies. Physical Assessment:   Vitals:   10/29/22 1624  BP: 118/78  Pulse: 81  Weight: 159 lb (72.1 kg)  Body mass index is 30.04 kg/m.        Physical Examination:   General appearance: Well appearing, and in no distress  Mental status: Alert, oriented to person, place, and time  Skin: Warm & dry  Cardiovascular: Normal heart rate noted  Respiratory: Normal respiratory effort, no distress  Abdomen: Soft, gravid, nontender  Pelvic: Cervical exam deferred         Extremities:    Fetal Status: Fetal Heart Rate (bpm): 149   Movement: Absent    Chaperone: N/A   No results found for this or any previous visit (from the past 24 hour(s)).  Assessment & Plan:  1) Low-risk pregnancy G1P0000 at [redacted]w[redacted]d with an Estimated Date of Delivery: 04/12/23   2) Fatigue, hgb 14.4 on 5/28, gets up a lot at night to pee, so not sleeping well, likely leading to daytime fatigue  3) Anxiety> doing well, no meds, no depression, declines IBH referral   Meds: No orders of the defined types were placed in  this encounter.  Labs/procedures today: 2nd IT  Plan:  Continue routine obstetrical care  Next visit: prefers will be in person for u/s     Reviewed: Preterm labor symptoms and general obstetric precautions including but not limited to vaginal bleeding, contractions, leaking of fluid and fetal movement were reviewed in detail with the patient.  All questions were answered. Does have home bp cuff. Office bp cuff given: not applicable. Check bp weekly, let us know if consistently >140 and/or >90.  Follow-up: Return for As scheduled.  Future Appointments  Date Time Provider Department Center  12/05/2022  3:00 PM [facility] [place] [facility] None  12/05/2022  3:50 PM Cresenzo-Dishmon, Scarlette Calico, CNM [facility]    Orders Placed This Encounter  Procedures   INTEGRATED 2   Cheral Marker CNM, [facility] 10/29/2022 4:47 PM

## 2022-10-29 NOTE — Patient Instructions (Signed)
[place], thank you for choosing our office today! We appreciate the opportunity to meet your healthcare needs. You may receive a short survey by mail, e-mail, or through [employer]. If you are happy with your care we would appreciate if you could take just a few minutes to complete the survey questions. We read all of your comments and take your feedback very seriously. Thank you again for choosing our office.  Center for [employer] Team at [facility] at [facility] ([address] [place], [place] [address]) Entrance C, located off of [address] Free 24/7 valet parking  Go to [email or web address] to register for FREE online childbirth classes  Call the office [phone]) or go to [facility] if: You begin to severe cramping Your water breaks.  Sometimes it is a big gush of fluid, sometimes it is just a trickle that keeps getting your panties wet or running down your legs You have vaginal bleeding.  It is normal to have a small amount of spotting if your cervix was checked.   [facility] Pediatricians/Family Doctors [facility]): [address] Colette Ribas, [phone]           [facility]: [address], [phone]                [facility]): [address], [phone] (call to ask if accepting patients) [facility] Department: [address], [place], [phone]    [facility] Pediatricians/Family Doctors [facility]): [phone] [address], [phone] [facility]: [address] [place], [phone] [facility]: [address], [phone]  [facility] Doctors  [facility]): [phone] [facility]: [address], [phone]   [facility] Doctors [facility]: [address] [phone]  [facility] Doctors  [facility]: [phone], [phone]  Home Blood Pressure Monitoring for Patients   Your provider has recommended that you check your blood pressure (BP) at least once a week at home. If you do not have a blood pressure cuff at home, one will be provided for you. Contact your provider if you have not received your monitor within 1 week.   Helpful Tips for Accurate Home Blood Pressure Checks  Don't smoke, exercise, or drink caffeine 30 minutes before checking your BP Use the restroom before checking your BP (a full bladder can raise your pressure) Relax in a comfortable upright chair Feet on the ground Left arm resting comfortably on a flat surface at the level of your heart Legs uncrossed Back supported Sit quietly and don't talk Place the cuff on your bare arm Adjust snuggly, so that only two fingertips can fit between your skin and the top of the cuff Check 2 readings separated by at least one minute Keep a log of your BP readings For a visual, please reference this diagram: http://ccnc.care/bpdiagram  Provider Name: [facility]     Phone: [phone]  Zone 1: ALL CLEAR  Continue to monitor your symptoms:  BP reading is less than 140 (top number) or less than 90 (bottom number)  No right upper stomach pain No headaches or seeing spots No feeling nauseated or throwing up No swelling in face and hands  Zone 2: CAUTION Call your doctor's office for any of the following:  BP reading is greater than 140 (top number) or greater than  90 (bottom number)  Stomach pain under your ribs in the middle or right side Headaches or seeing spots Feeling nauseated or throwing up Swelling in face and hands  Zone 3: EMERGENCY  Seek immediate medical care if you have any of the following:  BP reading is greater than160 (top number) or greater than 110 (bottom number) Severe headaches not improving with Tylenol Serious difficulty catching your breath Any worsening symptoms from  Zone 2     Second Trimester of Pregnancy The second trimester is from week 14 through week 27 (months 4 through 6). The second trimester is often a time when you feel your best. Your body has adjusted to being pregnant, and you begin to feel better physically. Usually, morning sickness has lessened or quit completely, you may have more energy, and you may have an increase in appetite. The second trimester is also a time when the fetus is growing rapidly. At the end of the sixth month, the fetus is about 9 inches long and weighs about 1 pounds. You will likely begin to feel the baby move (quickening) between 16 and 20 weeks of pregnancy. Body changes during your second trimester Your body continues to go through many changes during your second trimester. The changes vary from woman to woman. Your weight will continue to increase. You will notice your lower abdomen bulging out. You may begin to get stretch marks on your hips, abdomen, and breasts. You may develop headaches that can be relieved by medicines. The medicines should be approved by your health care provider. You may urinate more often because the fetus is pressing on your bladder. You may develop or continue to have heartburn as a result of your pregnancy. You may develop constipation because certain hormones are causing the muscles that push waste through your intestines to slow down. You may develop hemorrhoids or swollen, bulging veins (varicose veins). You may have back pain. This is caused by: Weight gain. Pregnancy hormones that are relaxing the joints in your pelvis. A shift in weight and the muscles that support your balance. Your breasts will continue to grow and they will continue to become tender. Your gums may bleed and may be sensitive to brushing and flossing. Dark spots or blotches (chloasma, mask of pregnancy) may develop on your face. This will likely fade after the baby is born. A dark line from your belly button to  the pubic area (linea nigra) may appear. This will likely fade after the baby is born. You may have changes in your hair. These can include thickening of your hair, rapid growth, and changes in texture. Some women also have hair loss during or after pregnancy, or hair that feels dry or thin. Your hair will most likely return to normal after your baby is born.  What to expect at prenatal visits During a routine prenatal visit: You will be weighed to make sure you and the fetus are growing normally. Your blood pressure will be taken. Your abdomen will be measured to track your baby's growth. The fetal heartbeat will be listened to. Any test results from the previous visit will be discussed.  Your health care provider may ask you: How you are feeling. If you are feeling the baby move. If you have had any abnormal symptoms, such as leaking fluid, bleeding, severe headaches, or abdominal cramping. If you are using any tobacco products, including cigarettes, chewing tobacco, and electronic cigarettes. If you have any questions.  Other tests that may be performed during   your second trimester include: Blood tests that check for: Low iron levels (anemia). High blood sugar that affects pregnant women (gestational diabetes) between 24 and 28 weeks. Rh antibodies. This is to check for a protein on red blood cells (Rh factor). Urine tests to check for infections, diabetes, or protein in the urine. An ultrasound to confirm the proper growth and development of the baby. An amniocentesis to check for possible genetic problems. Fetal screens for spina bifida and Down syndrome. HIV (human immunodeficiency virus) testing. Routine prenatal testing includes screening for HIV, unless you choose not to have this test.  Follow these instructions at home: Medicines Follow your health care provider's instructions regarding medicine use. Specific medicines may be either safe or unsafe to take during  pregnancy. Take a prenatal vitamin that contains at least 600 micrograms (mcg) of folic acid. If you develop constipation, try taking a stool softener if your health care provider approves. Eating and drinking Eat a balanced diet that includes fresh fruits and vegetables, whole grains, good sources of protein such as meat, eggs, or tofu, and low-fat dairy. Your health care provider will help you determine the amount of weight gain that is right for you. Avoid raw meat and uncooked cheese. These carry germs that can cause birth defects in the baby. If you have low calcium intake from food, talk to your health care provider about whether you should take a daily calcium supplement. Limit foods that are high in fat and processed sugars, such as fried and sweet foods. To prevent constipation: Drink enough fluid to keep your urine clear or pale yellow. Eat foods that are high in fiber, such as fresh fruits and vegetables, whole grains, and beans. Activity Exercise only as directed by your health care provider. Most women can continue their usual exercise routine during pregnancy. Try to exercise for 30 minutes at least 5 days a week. Stop exercising if you experience uterine contractions. Avoid heavy lifting, wear low heel shoes, and practice good posture. A sexual relationship may be continued unless your health care provider directs you otherwise. Relieving pain and discomfort Wear a good support bra to prevent discomfort from breast tenderness. Take warm sitz baths to soothe any pain or discomfort caused by hemorrhoids. Use hemorrhoid cream if your health care provider approves. Rest with your legs elevated if you have leg cramps or low back pain. If you develop varicose veins, wear support hose. Elevate your feet for 15 minutes, 3-4 times a day. Limit salt in your diet. Prenatal Care Write down your questions. Take them to your prenatal visits. Keep all your prenatal visits as told by your health  care provider. This is important. Safety Wear your seat belt at all times when driving. Make a list of emergency phone numbers, including numbers for family, friends, the hospital, and police and fire departments. General instructions Ask your health care provider for a referral to a local prenatal education class. Begin classes no later than the beginning of month 6 of your pregnancy. Ask for help if you have counseling or nutritional needs during pregnancy. Your health care provider can offer advice or refer you to specialists for help with various needs. Do not use hot tubs, steam rooms, or saunas. Do not douche or use tampons or scented sanitary pads. Do not cross your legs for long periods of time. Avoid cat litter boxes and soil used by cats. These carry germs that can cause birth defects in the baby and possibly loss of the   fetus by miscarriage or stillbirth. Avoid all smoking, herbs, alcohol, and unprescribed drugs. Chemicals in these products can affect the formation and growth of the baby. Do not use any products that contain nicotine or tobacco, such as cigarettes and e-cigarettes. If you need help quitting, ask your health care provider. Visit your dentist if you have not gone yet during your pregnancy. Use a soft toothbrush to brush your teeth and be gentle when you floss. Contact a health care provider if: You have dizziness. You have mild pelvic cramps, pelvic pressure, or nagging pain in the abdominal area. You have persistent nausea, vomiting, or diarrhea. You have a bad smelling vaginal discharge. You have pain when you urinate. Get help right away if: You have a fever. You are leaking fluid from your vagina. You have spotting or bleeding from your vagina. You have severe abdominal cramping or pain. You have rapid weight gain or weight loss. You have shortness of breath with chest pain. You notice sudden or extreme swelling of your face, hands, ankles, feet, or legs. You  have not felt your baby move in over an hour. You have severe headaches that do not go away when you take medicine. You have vision changes. Summary The second trimester is from week 14 through week 27 (months 4 through 6). It is also a time when the fetus is growing rapidly. Your body goes through many changes during pregnancy. The changes vary from woman to woman. Avoid all smoking, herbs, alcohol, and unprescribed drugs. These chemicals affect the formation and growth your baby. Do not use any tobacco products, such as cigarettes, chewing tobacco, and e-cigarettes. If you need help quitting, ask your health care provider. Contact your health care provider if you have any questions. Keep all prenatal visits as told by your health care provider. This is important. This information is not intended to replace advice given to you by your health care provider. Make sure you discuss any questions you have with your health care provider. Document Released: 04/16/2001 Document Revised: 09/28/2015 Document Reviewed: 06/23/2012 Elsevier Interactive Patient Education  2017 Elsevier Inc.  

## 2022-10-31 LAB — INTEGRATED 2
AFP MoM: 0.8
Alpha-Fetoprotein: 28.2 ng/mL
Crown Rump Length: 65.7 mm
DIA MoM: 0.67
DIA Value: 103.5 pg/mL
Estriol, Unconjugated: 0.76 ng/mL
Gest. Age on Collection Date: 12.7 weeks
Gestational Age: 16.7 weeks
Maternal Age at EDD: 23.8 yr
Nuchal Translucency (NT): 1.9 mm
Nuchal Translucency MoM: 1.29
Number of Fetuses: 1
PAPP-A MoM: 1.13
PAPP-A Value: 1240.3 ng/mL
Test Results:: NEGATIVE
Weight: 148 [lb_av]
Weight: 148 [lb_av]
hCG MoM: 0.4
hCG Value: 12.1 IU/mL
uE3 MoM: 0.68

## 2022-11-15 ENCOUNTER — Other Ambulatory Visit: Payer: Self-pay | Admitting: Adult Health

## 2022-11-15 MED ORDER — ONDANSETRON HCL 4 MG PO TABS: 4.0000 mg | ORAL_TABLET | Freq: Three times a day (TID) | ORAL | 1 refills | Status: DC | PRN

## 2022-11-15 NOTE — Progress Notes (Signed)
-  Rx. [email or web address] 

## 2022-12-05 ENCOUNTER — Ambulatory Visit (INDEPENDENT_AMBULATORY_CARE_PROVIDER_SITE_OTHER): Payer: 59 | Admitting: Advanced Practice Midwife

## 2022-12-05 ENCOUNTER — Ambulatory Visit (INDEPENDENT_AMBULATORY_CARE_PROVIDER_SITE_OTHER): Payer: 59

## 2022-12-05 ENCOUNTER — Encounter: Payer: Self-pay | Admitting: Advanced Practice Midwife

## 2022-12-05 VITALS — BP 129/80 | HR 96 | Wt 175.0 lb

## 2022-12-05 DIAGNOSIS — Z3402 Encounter for supervision of normal first pregnancy, second trimester: Secondary | ICD-10-CM

## 2022-12-05 DIAGNOSIS — Z3A21 21 weeks gestation of pregnancy: Secondary | ICD-10-CM | POA: Diagnosis not present

## 2022-12-05 DIAGNOSIS — Z363 Encounter for antenatal screening for malformations: Secondary | ICD-10-CM

## 2022-12-05 DIAGNOSIS — Z3401 Encounter for supervision of normal first pregnancy, first trimester: Secondary | ICD-10-CM

## 2022-12-05 NOTE — Progress Notes (Addendum)
   LOW-RISK PREGNANCY VISIT Patient name: Norma Aguilar MRN [id number]  Date of birth: 2000-10-19 Chief Complaint:   Routine Prenatal Visit and Pregnancy Ultrasound  History of Present Illness:   Norma Kuhar is a 22 y.o. G55P0000 female at [redacted]w[redacted]d with an Estimated Date of Delivery: 04/12/23 being seen today for ongoing management of a low-risk pregnancy.  Today she reports no complaints. Contractions: Not present.  .  Movement: Present. denies leaking of fluid. Review of Systems:   Pertinent items are noted in HPI Denies abnormal vaginal discharge w/ itching/odor/irritation, headaches, visual changes, shortness of breath, chest pain, abdominal pain, severe nausea/vomiting, or problems with urination or bowel movements unless otherwise stated above. Pertinent History Reviewed:  Reviewed past medical,surgical, social, obstetrical and family history.  Reviewed problem list, medications and allergies. Physical Assessment:   Vitals:   12/05/22 1605  BP: 129/80  Pulse: 96  Weight: 175 lb (79.4 kg)  Body mass index is 33.07 kg/m.        Physical Examination:   General appearance: Well appearing, and in no distress  Mental status: Alert, oriented to person, place, and time  Skin: Warm & dry  Cardiovascular: Normal heart rate noted  Respiratory: Normal respiratory effort, no distress  Abdomen: Soft, gravid, nontender  Pelvic: Cervical exam deferred         Extremities: Edema: None  Fetal Status: Fetal Heart Rate (bpm): 148   Movement: Present    [place] 21+5 wks,cephalic,anterior placenta gr 0,cx 3.8 cm,normal ovaries,SVP of fluid 5.7 cm,FHR 138 bpm,EFW 408 g 21%,anatomy complete,no obvious abnormalities        No results found for this or any previous visit (from the past 24 hour(s)).  Assessment & Plan:    Pregnancy: G1P0000 at [redacted]w[redacted]d 1. Encounter for supervision of normal first pregnancy in first trimester   2. [redacted] weeks gestation of pregnancy weight warning.       Meds: No  orders of the defined types were placed in this encounter.  Labs/procedures today: anatomy scan  Plan:  Continue routine obstetrical care  Next visit: prefers in person    Reviewed:  general obstetric precautions including but not limited to vaginal bleeding, contractions, leaking of fluid and fetal movement were reviewed in detail with the patient.  All questions were answered. Has home bp cuff. . Check bp weekly, let us know if >140/90.   Follow-up: Return in about 3 weeks (around 12/26/2022) for LROB and 6 weeks for LROB/PN2.  No future appointments.  No orders of the defined types were placed in this encounter.  Jacklyn Shell DNP, CNM 12/05/2022 4:41 PM

## 2022-12-05 NOTE — Patient Instructions (Addendum)
[place] Kizer, I greatly value your feedback.  If you receive a survey following your visit with [place] today, we appreciate you taking the time to fill it out.  Thanks, Cathie Beams, CNM     [facility] HAS MOVED!!! It is now [facility] ([address] [place], [place] [address]) Entrance located off of [address] Free 24/7 valet parking   Go to [email or web address] to register for FREE online childbirth classes    Second Trimester of Pregnancy The second trimester is from week 14 through week 27 (months 4 through 6). The second trimester is often a time when you feel your best. Your body has adjusted to being pregnant, and you begin to feel better physically. Usually, morning sickness has lessened or quit completely, you may have more energy, and you may have an increase in appetite. The second trimester is also a time when the fetus is growing rapidly. At the end of the sixth month, the fetus is about 9 inches long and weighs about 1 pounds. You will likely begin to feel the baby move (quickening) between 16 and 20 weeks of pregnancy. Body changes during your second trimester Your body continues to go through many changes during your second trimester. The changes vary from woman to woman. Your weight will continue to increase. You will notice your lower abdomen bulging out. You may begin to get stretch marks on your hips, abdomen, and breasts. You may develop headaches that can be relieved by medicines. The medicines should be approved by your health care provider. You may urinate more often because the fetus is pressing on your bladder. You may develop or continue to have heartburn as a result of your pregnancy. You may develop constipation because certain hormones are causing the muscles that push waste through your intestines to slow down. You may develop hemorrhoids or swollen, bulging veins (varicose veins). You may have back pain. This is  caused by: Weight gain. Pregnancy hormones that are relaxing the joints in your pelvis. A shift in weight and the muscles that support your balance. Your breasts will continue to grow and they will continue to become tender. Your gums may bleed and may be sensitive to brushing and flossing. Dark spots or blotches (chloasma, mask of pregnancy) may develop on your face. This will likely fade after the baby is born. A dark line from your belly button to the pubic area (linea nigra) may appear. This will likely fade after the baby is born. You may have changes in your hair. These can include thickening of your hair, rapid growth, and changes in texture. Some women also have hair loss during or after pregnancy, or hair that feels dry or thin. Your hair will most likely return to normal after your baby is born.  What to expect at prenatal visits During a routine prenatal visit: You will be weighed to make sure you and the fetus are growing normally. Your blood pressure will be taken. Your abdomen will be measured to track your baby's growth. The fetal heartbeat will be listened to. Any test results from the previous visit will be discussed.  Your health care provider may ask you: How you are feeling. If you are feeling the baby move. If you have had any abnormal symptoms, such as leaking fluid, bleeding, severe headaches, or abdominal cramping. If you are using any tobacco products, including cigarettes, chewing tobacco, and electronic cigarettes. If you have any questions.  Other tests that  may be performed during your second trimester include: Blood tests that check for: Low iron levels (anemia). High blood sugar that affects pregnant women (gestational diabetes) between 3 and 28 weeks. Rh antibodies. This is to check for a protein on red blood cells (Rh factor). Urine tests to check for infections, diabetes, or protein in the urine. An ultrasound to confirm the proper growth and  development of the baby. An amniocentesis to check for possible genetic problems. Fetal screens for spina bifida and Down syndrome. HIV (human immunodeficiency virus) testing. Routine prenatal testing includes screening for HIV, unless you choose not to have this test.  Follow these instructions at home: Medicines Follow your health care provider's instructions regarding medicine use. Specific medicines may be either safe or unsafe to take during pregnancy. Take a prenatal vitamin that contains at least 600 micrograms (mcg) of folic acid. If you develop constipation, try taking a stool softener if your health care provider approves. Eating and drinking Eat a balanced diet that includes fresh fruits and vegetables, whole grains, good sources of protein such as meat, eggs, or tofu, and low-fat dairy. Your health care provider will help you determine the amount of weight gain that is right for you. Avoid raw meat and uncooked cheese. These carry germs that can cause birth defects in the baby. If you have low calcium intake from food, talk to your health care provider about whether you should take a daily calcium supplement. Limit foods that are high in fat and processed sugars, such as fried and sweet foods. To prevent constipation: Drink enough fluid to keep your urine clear or pale yellow. Eat foods that are high in fiber, such as fresh fruits and vegetables, whole grains, and beans. Activity Exercise only as directed by your health care provider. Most women can continue their usual exercise routine during pregnancy. Try to exercise for 30 minutes at least 5 days a week. Stop exercising if you experience uterine contractions. Avoid heavy lifting, wear low heel shoes, and practice good posture. A sexual relationship may be continued unless your health care provider directs you otherwise. Relieving pain and discomfort Wear a good support bra to prevent discomfort from breast tenderness. Take  warm sitz baths to soothe any pain or discomfort caused by hemorrhoids. Use hemorrhoid cream if your health care provider approves. Rest with your legs elevated if you have leg cramps or low back pain. If you develop varicose veins, wear support hose. Elevate your feet for 15 minutes, 3-4 times a day. Limit salt in your diet. Prenatal Care Write down your questions. Take them to your prenatal visits. Keep all your prenatal visits as told by your health care provider. This is important. Safety Wear your seat belt at all times when driving. Make a list of emergency phone numbers, including numbers for family, friends, the hospital, and police and fire departments. General instructions Ask your health care provider for a referral to a local prenatal education class. Begin classes no later than the beginning of month 6 of your pregnancy. Ask for help if you have counseling or nutritional needs during pregnancy. Your health care provider can offer advice or refer you to specialists for help with various needs. Do not use hot tubs, steam rooms, or saunas. Do not douche or use tampons or scented sanitary pads. Do not cross your legs for long periods of time. Avoid cat litter boxes and soil used by cats. These carry germs that can cause birth defects in the baby and  possibly loss of the fetus by miscarriage or stillbirth. Avoid all smoking, herbs, alcohol, and unprescribed drugs. Chemicals in these products can affect the formation and growth of the baby. Do not use any products that contain nicotine or tobacco, such as cigarettes and e-cigarettes. If you need help quitting, ask your health care provider. Visit your dentist if you have not gone yet during your pregnancy. Use a soft toothbrush to brush your teeth and be gentle when you floss. Contact a health care provider if: You have dizziness. You have mild pelvic cramps, pelvic pressure, or nagging pain in the abdominal area. You have persistent  nausea, vomiting, or diarrhea. You have a bad smelling vaginal discharge. You have pain when you urinate. Get help right away if: You have a fever. You are leaking fluid from your vagina. You have spotting or bleeding from your vagina. You have severe abdominal cramping or pain. You have rapid weight gain or weight loss. You have shortness of breath with chest pain. You notice sudden or extreme swelling of your face, hands, ankles, feet, or legs. You have not felt your baby move in over an hour. You have severe headaches that do not go away when you take medicine. You have vision changes. Summary The second trimester is from week 14 through week 27 (months 4 through 6). It is also a time when the fetus is growing rapidly. Your body goes through many changes during pregnancy. The changes vary from woman to woman. Avoid all smoking, herbs, alcohol, and unprescribed drugs. These chemicals affect the formation and growth your baby. Do not use any tobacco products, such as cigarettes, chewing tobacco, and e-cigarettes. If you need help quitting, ask your health care provider. Contact your health care provider if you have any questions. Keep all prenatal visits as told by your health care provider. This is important. This information is not intended to replace advice given to you by your health care provider. Make sure you discuss any questions you have with your health care provider.  [employer] exercises

## 2022-12-05 NOTE — Progress Notes (Signed)
[place] 21+5 wks,cephalic,anterior placenta gr 0,cx 3.8 cm,normal ovaries,SVP of fluid 5.7 cm,FHR 138 bpm,EFW 408 g 21%,anatomy complete,no obvious abnormalities

## 2022-12-25 ENCOUNTER — Inpatient Hospital Stay (HOSPITAL_COMMUNITY)
Admission: AD | Admit: 2022-12-25 | Discharge: 2022-12-25 | Disposition: A | Discharge status: Home / Self Care | Payer: 59 | Attending: Obstetrics and Gynecology | Admitting: Obstetrics and Gynecology

## 2022-12-25 ENCOUNTER — Encounter (HOSPITAL_COMMUNITY): Payer: Self-pay | Admitting: Obstetrics and Gynecology

## 2022-12-25 ENCOUNTER — Encounter: Payer: Self-pay | Admitting: Advanced Practice Midwife

## 2022-12-25 ENCOUNTER — Ambulatory Visit: Payer: 59 | Admitting: Advanced Practice Midwife

## 2022-12-25 VITALS — BP 140/85 | HR 111 | Wt 179.0 lb

## 2022-12-25 DIAGNOSIS — Z3A24 24 weeks gestation of pregnancy: Secondary | ICD-10-CM

## 2022-12-25 DIAGNOSIS — F418 Other specified anxiety disorders: Secondary | ICD-10-CM | POA: Insufficient documentation

## 2022-12-25 DIAGNOSIS — R102 Pelvic and perineal pain: Secondary | ICD-10-CM | POA: Diagnosis present

## 2022-12-25 DIAGNOSIS — O26892 Other specified pregnancy related conditions, second trimester: Secondary | ICD-10-CM | POA: Diagnosis not present

## 2022-12-25 DIAGNOSIS — R030 Elevated blood-pressure reading, without diagnosis of hypertension: Secondary | ICD-10-CM

## 2022-12-25 DIAGNOSIS — R519 Headache, unspecified: Secondary | ICD-10-CM | POA: Diagnosis present

## 2022-12-25 DIAGNOSIS — O162 Unspecified maternal hypertension, second trimester: Secondary | ICD-10-CM

## 2022-12-25 DIAGNOSIS — Z3401 Encounter for supervision of normal first pregnancy, first trimester: Secondary | ICD-10-CM

## 2022-12-25 DIAGNOSIS — F419 Anxiety disorder, unspecified: Secondary | ICD-10-CM | POA: Diagnosis not present

## 2022-12-25 LAB — COMPREHENSIVE METABOLIC PANEL
ALT: 34 U/L (ref 0–44)
AST: 26 U/L (ref 15–41)
Albumin: 3.6 g/dL (ref 3.5–5.0)
Alkaline Phosphatase: 52 U/L (ref 38–126)
Anion gap: 11 (ref 5–15)
BUN: 10 mg/dL (ref 6–20)
CO2: 20 mmol/L — ABNORMAL LOW (ref 22–32)
Calcium: 8.7 mg/dL — ABNORMAL LOW (ref 8.9–10.3)
Chloride: 103 mmol/L (ref 98–111)
Creatinine, Ser: 0.66 mg/dL (ref 0.44–1.00)
GFR, Estimated: >60 mL/min (ref >60)
Glucose, Bld: 86 mg/dL (ref 70–99)
Potassium: 3.7 mmol/L (ref 3.5–5.1)
Sodium: 134 mmol/L — ABNORMAL LOW (ref 135–145)
Total Bilirubin: 0.3 mg/dL (ref 0.3–1.2)
Total Protein: 6.4 g/dL — ABNORMAL LOW (ref 6.5–8.1)

## 2022-12-25 LAB — URINALYSIS, ROUTINE W REFLEX MICROSCOPIC
Bilirubin Urine: NEGATIVE
Glucose, UA: NEGATIVE mg/dL
Hgb urine dipstick: NEGATIVE
Ketones, ur: NEGATIVE mg/dL
Leukocytes,Ua: NEGATIVE
Nitrite: NEGATIVE
Protein, ur: NEGATIVE mg/dL
Specific Gravity, Urine: 1.004 — ABNORMAL LOW (ref 1.005–1.030)
pH: 6 (ref 5.0–8.0)

## 2022-12-25 LAB — CBC
HCT: 39.1 % (ref 36.0–46.0)
Hemoglobin: 13.5 g/dL (ref 12.0–15.0)
MCH: 31.3 pg (ref 26.0–34.0)
MCHC: 34.5 g/dL (ref 30.0–36.0)
MCV: 90.5 fL (ref 80.0–100.0)
Platelets: 226 10*3/uL (ref 150–400)
RBC: 4.32 MIL/uL (ref 3.87–5.11)
RDW: 12.2 % (ref 11.5–15.5)
WBC: 13.1 10*3/uL — ABNORMAL HIGH (ref 4.0–10.5)
nRBC: 0 % (ref 0.0–0.2)

## 2022-12-25 LAB — POCT URINALYSIS DIPSTICK OB
Blood, UA: NEGATIVE
Glucose, UA: NEGATIVE
Ketones, UA: NEGATIVE
Nitrite, UA: NEGATIVE
POC,PROTEIN,UA: NEGATIVE

## 2022-12-25 LAB — PROTEIN / CREATININE RATIO, URINE
Creatinine, Urine: 24 mg/dL
Total Protein, Urine: <6 mg/dL

## 2022-12-25 NOTE — MAU Provider Note (Signed)
History     CSN: [id number]  Arrival date and time: 12/25/22 1658   Event Date/Time   First Provider Initiated Contact with Patient 12/25/22 1837      Chief Complaint  Patient presents with   Hypertension   [place] Cabiness , a  22 y.o. G1P0000 at [redacted]w[redacted]d presents to MAU after being sent over from the office for elevated BPs. Patient states that this is her first occurrence of elevated BPs. She states that she had a bad headache yesterday with blurred vision, but denies pain for visual disturbances today. She denies epigastric pain, and new onset of swelling. She denies vaginal bleeding leaking of fluid and abdominal pain. She endorses positive fetal movement.         OB History     Gravida  1   Para  0   Term  0   Preterm  0   AB  0   Living  0      SAB  0   IAB  0   Ectopic  0   Multiple  0   Live Births  0           Past Medical History:  Diagnosis Date   ADHD (attention deficit hyperactivity disorder)    ADHD (attention deficit hyperactivity disorder)    Anxiety    Oppositional defiant disorder     Past Surgical History:  Procedure Laterality Date   MOUTH SURGERY      Family History  Problem Relation Age of Onset   Bipolar disorder Mother    Stroke Father 87   Liver disease Father    Gallbladder disease Paternal Uncle    COPD Paternal Grandmother    Diabetes Paternal Grandfather    Heart attack Paternal Grandfather    Gallbladder disease Paternal Grandfather    ADD / ADHD Cousin     Social History   Tobacco Use   Smoking status: Never   Smokeless tobacco: Never  Vaping Use   Vaping status: Former  Substance Use Topics   Alcohol use: Not Currently    Comment: occ   Drug use: No    Allergies:  Allergies  Allergen Reactions   Phenergan [Promethazine Hcl]    Reglan [Metoclopramide]    Latex Itching and Rash   Lexapro [Escitalopram Oxalate] Rash    No medications prior to admission.    Review of Systems   Constitutional:  Negative for chills, fatigue and fever.  Eyes:  Positive for visual disturbance. Negative for pain.  Respiratory:  Negative for apnea, shortness of breath and wheezing.   Cardiovascular:  Negative for chest pain and palpitations.  Gastrointestinal:  Negative for abdominal pain, constipation, diarrhea, nausea and vomiting.  Genitourinary:  Negative for difficulty urinating, dysuria, pelvic pain, vaginal bleeding, vaginal discharge and vaginal pain.  Musculoskeletal:  Negative for back pain.  Neurological:  Positive for headaches. Negative for seizures and weakness.  Psychiatric/Behavioral:  Negative for suicidal ideas.    Physical Exam   Blood pressure 113/66, pulse 97, temperature 98.3 F (36.8 C), resp. rate 18, height 5\' 2"  (1.575 m), weight 83 kg, last menstrual period 06/24/2022, SpO2 98%.  Physical Exam Vitals and nursing note reviewed.  Constitutional:      General: She is not in acute distress.    Appearance: Normal appearance.  HENT:     Head: Normocephalic.  Pulmonary:     Effort: Pulmonary effort is normal.  Abdominal:     Palpations: Abdomen is soft.  Comments: Gravid uterus   Musculoskeletal:        General: No swelling or tenderness. Normal range of motion.     Cervical back: Normal range of motion.     Right lower leg: No edema.     Left lower leg: No edema.  Skin:    General: Skin is warm and dry.  Neurological:     Mental Status: She is alert and oriented to person, place, and time.     Gait: Gait normal.     Deep Tendon Reflexes: Reflexes normal.  Psychiatric:        Mood and Affect: Mood normal.    FHT: 150 bpm with moderate variability. Accels present (appropriate for gestational age)  Toco: quiet   MAU Course  Procedures Orders Placed This Encounter  Procedures   Urinalysis, Routine w reflex microscopic -Urine, Clean Catch   Protein / creatinine ratio, urine   CBC   Comprehensive metabolic panel   Discharge patient    Patient Vitals for the past 24 hrs:  BP Temp Pulse Resp SpO2 Height Weight  12/25/22 1935 -- -- -- -- 98 % -- --  12/25/22 1930 113/66 -- 97 -- 97 % -- --  12/25/22 1925 -- -- -- -- 98 % -- --  12/25/22 1920 -- -- -- -- 98 % -- --  12/25/22 1915 121/72 -- 96 -- 98 % -- --  12/25/22 1910 -- -- -- -- 98 % -- --  12/25/22 1905 -- -- -- -- 98 % -- --  12/25/22 1900 126/77 -- 97 -- 98 % -- --  12/25/22 1854 -- -- -- -- 98 % -- --  12/25/22 1845 131/73 -- (!) 101 -- -- -- --  12/25/22 1835 -- -- -- -- 98 % -- --  12/25/22 1830 126/78 -- 98 -- 97 % -- --  12/25/22 1825 -- -- -- -- 97 % -- --  12/25/22 1820 -- -- -- -- 97 % -- --  12/25/22 1815 128/80 -- (!) 106 -- 97 % -- --  12/25/22 1810 -- -- -- -- 98 % -- --  12/25/22 1731 (!) 149/78 98.3 F (36.8 C) (!) 125 18 -- 5\' 2"  (1.575 m) 83 kg   Results for orders placed or performed during the hospital encounter of 12/25/22 (from the past 24 hour(s))  Urinalysis, Routine w reflex microscopic -Urine, Clean Catch     Status: Abnormal   Collection Time: 12/25/22  5:38 PM  Result Value Ref Range   Color, Urine STRAW (A) YELLOW   APPearance HAZY (A) CLEAR   Specific Gravity, Urine 1.004 (L) 1.005 - 1.030   pH 6.0 5.0 - 8.0   Glucose, UA NEGATIVE NEGATIVE mg/dL   Hgb urine dipstick NEGATIVE NEGATIVE   Bilirubin Urine NEGATIVE NEGATIVE   Ketones, ur NEGATIVE NEGATIVE mg/dL   Protein, ur NEGATIVE NEGATIVE mg/dL   Nitrite NEGATIVE NEGATIVE   Leukocytes,Ua NEGATIVE NEGATIVE  Protein / creatinine ratio, urine     Status: None   Collection Time: 12/25/22  5:38 PM  Result Value Ref Range   Creatinine, Urine 24 mg/dL   Total Protein, Urine <6 mg/dL   Protein Creatinine Ratio        0.00 - 0.15 mg/mg[Cre]  CBC     Status: Abnormal   Collection Time: 12/25/22  6:03 PM  Result Value Ref Range   WBC 13.1 (H) 4.0 - 10.5 K/uL   RBC 4.32 3.87 - 5.11 MIL/uL   Hemoglobin 13.5 12.0 -  15.0 g/dL   HCT 16.1 09.6 - 04.5 %   MCV 90.5 80.0 - 100.0 fL    MCH 31.3 26.0 - 34.0 pg   MCHC 34.5 30.0 - 36.0 g/dL   RDW 40.9 81.1 - 91.4 %   Platelets 226 150 - 400 K/uL   nRBC 0.0 0.0 - 0.2 %  Comprehensive metabolic panel     Status: Abnormal   Collection Time: 12/25/22  6:03 PM  Result Value Ref Range   Sodium 134 (L) 135 - 145 mmol/L   Potassium 3.7 3.5 - 5.1 mmol/L   Chloride 103 98 - 111 mmol/L   CO2 20 (L) 22 - 32 mmol/L   Glucose, Bld 86 70 - 99 mg/dL   BUN 10 6 - 20 mg/dL   Creatinine, Ser 7.82 0.44 - 1.00 mg/dL   Calcium 8.7 (L) 8.9 - 10.3 mg/dL   Total Protein 6.4 (L) 6.5 - 8.1 g/dL   Albumin 3.6 3.5 - 5.0 g/dL   AST 26 15 - 41 U/L   ALT 34 0 - 44 U/L   Alkaline Phosphatase 52 38 - 126 U/L   Total Bilirubin 0.3 0.3 - 1.2 mg/dL   GFR, Estimated >95 >62 mL/min   Anion gap 11 5 - 15     MDM - Mild dehydration on UA  - Pre E labs normal  - 1 elevated BP on arrival to MAU. Otherwise normal.  - After further discussion patient reports a stressful situation that occurred prior to arrival to Appointment today and this was the first visit without her partner. She said that she became anxious again after she was told to report to MAU.  - Plan for discharge.   Assessment and Plan   1. Elevated BP without diagnosis of hypertension   2. Encounter for supervision of normal first pregnancy in first trimester   3. [redacted] weeks gestation of pregnancy   4. Anxious mood    - Reviewed results with patient and discussed likelihood of stress causing elevated BPs in office and in MAU today.  - Reccommended to keep stress and anxiety levels to minimal and reviewed some coping mechanisms to handle stress.  - Also reviewed PreE precautions and recommendations on when to return to MAU. Patient verbalized understanding.  - Message sent to office to have a BP check follow up later this week or early next week.  - Worsening signs and return precautions reviewed.  - FHT appropriate for gestational age at time discharge.  - Patient discharged home in  stable condition and mat return to MAU as needed   Claudette Head, MSN CNM 12/25/2022, 7:47 PM

## 2022-12-25 NOTE — Patient Instructions (Signed)
[place] Urquiza, I greatly value your feedback.  If you receive a survey following your visit with [place] today, we appreciate you taking the time to fill it out.  Thanks, Philipp Deputy, CNM   You will have your sugar test next visit.  Please do not eat or drink anything after midnight the night before you come, not even water.  You will be here for at least two hours.  Please make an appointment online for the bloodwork at [email or web address] for 8:30am (or as close to this as possible). Make sure you select the [facility] service center. The day of the appointment, check in with our office first, then you will go to [facility] to start the sugar test.    [facility] HAS MOVED!!! It is now [facility] ([address] [place], [place] [address]) Entrance C, located off of [address] valet parking  Go to [email or web address] to register for FREE online childbirth classes   Call the office [phone]) or go to [facility] if: You begin to have strong, frequent contractions Your water breaks.  Sometimes it is a big gush of fluid, sometimes it is just a trickle that keeps getting your panties wet or running down your legs You have vaginal bleeding.  It is normal to have a small amount of spotting if your cervix was checked.  You don't feel your baby moving like normal.  If you don't, get you something to eat and drink and lay down and focus on feeling your baby move.   If your baby is still not moving like normal, you should call the office or go to [facility].  [place] Pediatricians/Family Doctors: [facility] [phone]           [facility] [phone]                [facility] [phone] (usually not accepting new patients unless you have family there already, you are always welcome to call and ask)      [facility] Department [phone]       [facility] Pediatricians/Family Doctors:  [facility]: [phone] [facility]: [phone] [facility]: [phone]  [facility] Doctors:  [facility]: [phone]  [facility]: [phone]  [facility] Doctors: [facility]: [phone]   Home Blood Pressure Monitoring for Patients   Your provider has recommended that you check your blood pressure (BP) at least once a week at home. If you do not have a blood pressure cuff at home, one will be provided for you. Contact your provider if you have not received your monitor within 1 week.   Helpful Tips for Accurate Home Blood Pressure Checks  Don't smoke, exercise, or drink caffeine 30 minutes before checking your BP Use the restroom before checking your BP (a full bladder can raise your pressure) Relax in a comfortable upright chair Feet on the ground Left arm resting comfortably on a flat surface at the level of your heart Legs uncrossed Back supported Sit quietly and don't talk Place the cuff on your bare arm Adjust snuggly, so that only two fingertips can fit between your skin and the top of the cuff Check 2 readings separated by at least one minute Keep a log of your BP readings For a visual, please reference this diagram: http://ccnc.care/bpdiagram  Provider Name: [facility]     Phone: [phone]  Zone 1: ALL CLEAR  Continue to monitor your symptoms:  BP reading is less than 140 (top number) or less than 90 (bottom number)  No right upper stomach pain No headaches or seeing spots No feeling nauseated or throwing up No swelling in face and hands  Zone 2: CAUTION Call your doctor's office for any of the following:  BP reading is greater than 140 (top number) or greater than 90 (bottom number)  Stomach pain under your ribs in the middle or right side Headaches or seeing spots Feeling nauseated or throwing up Swelling in face and hands  Zone 3: EMERGENCY  Seek  immediate medical care if you have any of the following:  BP reading is greater than160 (top number) or greater than 110 (bottom number) Severe headaches not improving with Tylenol Serious difficulty catching your breath Any worsening symptoms from Zone 2   Second Trimester of Pregnancy The second trimester is from week 13 through week 28, months 4 through 6. The second trimester is often a time when you feel your best. Your body has also adjusted to being pregnant, and you begin to feel better physically. Usually, morning sickness has lessened or quit completely, you may have more energy, and you may have an increase in appetite. The second trimester is also a time when the fetus is growing rapidly. At the end of the sixth month, the fetus is about 9 inches long and weighs about 1 pounds. You will likely begin to feel the baby move (quickening) between 18 and 20 weeks of the pregnancy. BODY CHANGES Your body goes through many changes during pregnancy. The changes vary from woman to woman.  Your weight will continue to increase. You will notice your lower abdomen bulging out. You may begin to get stretch marks on your hips, abdomen, and breasts. You may develop headaches that can be relieved by medicines approved by your health care provider. You may urinate more often because the fetus is pressing on your bladder. You may develop or continue to have heartburn as a result of your pregnancy. You may develop constipation because certain hormones are causing the muscles that push waste through your intestines to slow down. You may develop hemorrhoids or swollen, bulging veins (varicose veins). You may have back pain because of the weight gain and pregnancy hormones relaxing your joints between the bones in your pelvis and as a result of a shift in weight and the muscles that support your balance. Your breasts will continue to grow and be tender. Your gums may bleed and may be sensitive to brushing  and flossing. Dark spots or blotches (chloasma, mask of pregnancy) may develop on your face. This will likely fade after the baby is born. A dark line from your belly button to the pubic area (linea nigra) may appear. This will likely fade after the baby is born. You may have changes in your hair. These can include thickening of your hair, rapid growth, and changes in texture. Some women also have hair loss during or after pregnancy, or hair that feels dry or thin. Your hair will most likely return to normal after your baby is born. WHAT TO EXPECT AT YOUR PRENATAL VISITS During a routine prenatal visit: You will be weighed to make sure you and the fetus are growing normally. Your blood pressure will be taken. Your abdomen will be measured to track your baby's growth. The fetal heartbeat will be listened to. Any test results from the previous visit will be discussed. Your health care provider  may ask you: How you are feeling. If you are feeling the baby move. If you have had any abnormal symptoms, such as leaking fluid, bleeding, severe headaches, or abdominal cramping. If you have any questions. Other tests that may be performed during your second trimester include: Blood tests that check for: Low iron levels (anemia). Gestational diabetes (between 24 and 28 weeks). Rh antibodies. Urine tests to check for infections, diabetes, or protein in the urine. An ultrasound to confirm the proper growth and development of the baby. An amniocentesis to check for possible genetic problems. Fetal screens for spina bifida and Down syndrome. HOME CARE INSTRUCTIONS  Avoid all smoking, herbs, alcohol, and unprescribed drugs. These chemicals affect the formation and growth of the baby. Follow your health care provider's instructions regarding medicine use. There are medicines that are either safe or unsafe to take during pregnancy. Exercise only as directed by your health care provider. Experiencing  uterine cramps is a good sign to stop exercising. Continue to eat regular, healthy meals. Wear a good support bra for breast tenderness. Do not use hot tubs, steam rooms, or saunas. Wear your seat belt at all times when driving. Avoid raw meat, uncooked cheese, cat litter boxes, and soil used by cats. These carry germs that can cause birth defects in the baby. Take your prenatal vitamins. Try taking a stool softener (if your health care provider approves) if you develop constipation. Eat more high-fiber foods, such as fresh vegetables or fruit and whole grains. Drink plenty of fluids to keep your urine clear or pale yellow. Take warm sitz baths to soothe any pain or discomfort caused by hemorrhoids. Use hemorrhoid cream if your health care provider approves. If you develop varicose veins, wear support hose. Elevate your feet for 15 minutes, 3-4 times a day. Limit salt in your diet. Avoid heavy lifting, wear low heel shoes, and practice good posture. Rest with your legs elevated if you have leg cramps or low back pain. Visit your dentist if you have not gone yet during your pregnancy. Use a soft toothbrush to brush your teeth and be gentle when you floss. A sexual relationship may be continued unless your health care provider directs you otherwise. Continue to go to all your prenatal visits as directed by your health care provider. SEEK MEDICAL CARE IF:  You have dizziness. You have mild pelvic cramps, pelvic pressure, or nagging pain in the abdominal area. You have persistent nausea, vomiting, or diarrhea. You have a bad smelling vaginal discharge. You have pain with urination. SEEK IMMEDIATE MEDICAL CARE IF:  You have a fever. You are leaking fluid from your vagina. You have spotting or bleeding from your vagina. You have severe abdominal cramping or pain. You have rapid weight gain or loss. You have shortness of breath with chest pain. You notice sudden or extreme swelling of your face,  hands, ankles, feet, or legs. You have not felt your baby move in over an hour. You have severe headaches that do not go away with medicine. You have vision changes. Document Released: 04/16/2001 Document Revised: 04/27/2013 Document Reviewed: 06/23/2012 [facility] Patient Information 2015 [place], [place]. This information is not intended to replace advice given to you by your health care provider. Make sure you discuss any questions you have with your health care provider.

## 2022-12-25 NOTE — MAU Note (Signed)
.  Norma Aguilar is a 22 y.o. at [redacted]w[redacted]d here in MAU reporting: [facility] at office visit. Pt stated she had a real bad headache yesterday but none today. While she was in the office she said she did see some spots but had not had thenm before. Stated on the way over to MAU she stared having some cramping on her right side. Reports good fetal movement . Denies any vag bleeding or leaking.  LMP:  Onset of complaint: today Pain score: 0 There were no vitals filed for this visit.   FHT: Lab orders placed from triage:

## 2022-12-25 NOTE — Progress Notes (Signed)
   LOW-RISK PREGNANCY VISIT Patient name: Norma Aguilar MRN [id number]  Date of birth: [birth year] Chief Complaint:   Routine Prenatal Visit (Low back pain)  History of Present Illness:   Norma Aguilar is a 22 y.o. G75P0000 female at [redacted]w[redacted]d with an Estimated Date of Delivery: 04/12/23 being seen today for ongoing management of a low-risk pregnancy.  Today she reports  having a severe H/A yesterday, but not really any vision changes; at work is lifting up to 60lb boxes and climbing ladders- requesting note with limitations . Contractions: Irritability. Vag. Bleeding: None.  Movement: Present. denies leaking of fluid. Review of Systems:   Pertinent items are noted in HPI Denies abnormal vaginal discharge w/ itching/odor/irritation, headaches, visual changes, shortness of breath, chest pain, abdominal pain, severe nausea/vomiting, or problems with urination or bowel movements unless otherwise stated above. Pertinent History Reviewed:  Reviewed past medical,surgical, social, obstetrical and family history.  Reviewed problem list, medications and allergies. Physical Assessment:   Vitals:   12/25/22 1533 12/25/22 1536 12/25/22 1601  BP: (!) 135/90 (!) 143/96 (!) 140/85  Pulse: (!) 101 (!) 108 (!) 111  Weight: 179 lb (81.2 kg)    Body mass index is 33.82 kg/m.        Physical Examination:   General appearance: Well appearing, and in no distress  Mental status: Alert, oriented to person, place, and time  Skin: Warm & dry  Cardiovascular: Normal heart rate noted  Respiratory: Normal respiratory effort, no distress  Abdomen: Soft, gravid, nontender  Pelvic: Cervical exam deferred         Extremities: Edema: None  Fetal Status: Fetal Heart Rate (bpm): 150 Fundal Height: 23 cm Movement: Present    Results for orders placed or performed in visit on 12/25/22 (from the past 24 hour(s))  POC Urinalysis Dipstick OB   Collection Time: 12/25/22  4:32 PM  Result Value Ref Range   Color, UA      Clarity, UA     Glucose, UA Negative Negative   Bilirubin, UA     Ketones, UA Negative    Spec Grav, UA     Blood, UA Negative    pH, UA     POC,PROTEIN,UA Negative Negative, Trace, Small (1+), Moderate (2+), Large (3+), 4+   Urobilinogen, UA     Nitrite, UA Negative    Leukocytes, UA Trace (A) Negative   Appearance     Odor      Assessment & Plan:  1) Low-risk pregnancy G1P0000 at [redacted]w[redacted]d with an Estimated Date of Delivery: 04/12/23   2) ^BP x 3 in office today, no history of elevated BPs; had a severe H/A yesterday, some floaters today; since we are unable to get a normal BP today and this is a new problem, will send her to MAU for bloodwork and serial BPs   Meds: No orders of the defined types were placed in this encounter.  Labs/procedures today: UA  Plan:  to MAU for pre-e workup  Reviewed: Preterm labor symptoms and general obstetric precautions including but not limited to vaginal bleeding, contractions, leaking of fluid and fetal movement were reviewed in detail with the patient.  All questions were answered. Has home bp cuff. Check bp weekly, let us know if >140/90.   Follow-up: Return for As scheduled. (Visit w PN2)  Orders Placed This Encounter  Procedures   POC Urinalysis Dipstick OB   Arabella Merles [facility] 12/25/2022 4:40 PM

## 2022-12-30 ENCOUNTER — Ambulatory Visit (INDEPENDENT_AMBULATORY_CARE_PROVIDER_SITE_OTHER): Payer: 59 | Admitting: *Deleted

## 2022-12-30 VITALS — BP 147/91 | HR 112

## 2022-12-30 DIAGNOSIS — Z3402 Encounter for supervision of normal first pregnancy, second trimester: Secondary | ICD-10-CM

## 2022-12-30 DIAGNOSIS — R030 Elevated blood-pressure reading, without diagnosis of hypertension: Secondary | ICD-10-CM

## 2022-12-30 DIAGNOSIS — Z0130 Encounter for examination of blood pressure without abnormal findings: Secondary | ICD-10-CM

## 2022-12-30 DIAGNOSIS — O0990 Supervision of high risk pregnancy, unspecified, unspecified trimester: Secondary | ICD-10-CM

## 2022-12-30 DIAGNOSIS — O139 Gestational [pregnancy-induced] hypertension without significant proteinuria, unspecified trimester: Secondary | ICD-10-CM | POA: Insufficient documentation

## 2022-12-30 DIAGNOSIS — O132 Gestational [pregnancy-induced] hypertension without significant proteinuria, second trimester: Secondary | ICD-10-CM

## 2022-12-30 LAB — POCT URINALYSIS DIPSTICK OB
Blood, UA: NEGATIVE
Glucose, UA: NEGATIVE
Ketones, UA: NEGATIVE
Leukocytes, UA: NEGATIVE
Nitrite, UA: NEGATIVE
POC,PROTEIN,UA: NEGATIVE

## 2022-12-30 MED ORDER — LABETALOL HCL 200 MG PO TABS: 200.0000 mg | ORAL_TABLET | Freq: Two times a day (BID) | ORAL | 3 refills | Status: DC

## 2022-12-30 NOTE — Addendum Note (Signed)
Addended by: Shawna Clamp R on: 12/30/2022 02:00 PM   Modules accepted: Orders

## 2022-12-30 NOTE — Progress Notes (Signed)
   NURSE VISIT- BLOOD PRESSURE CHECK  SUBJECTIVE:  Norma Aguilar is a 22 y.o. G19P0000 female here for BP check. She is [redacted]w[redacted]d pregnant    HYPERTENSION ROS:  Pregnant:  Severe headaches that don't go away with tylenol/other medicines: No  Visual changes (seeing spots/double/blurred vision) No  Severe pain under right breast breast or in center of upper chest No  Severe nausea/vomiting No  Taking medicines as instructed not applicable  OBJECTIVE:  BP (!) 147/91   Pulse (!) 112   LMP 06/24/2022 (Approximate)   Appearance alert, well appearing, and in no distress.  ASSESSMENT: Pregnancy [redacted]w[redacted]d  blood pressure check  PLAN: Discussed with Joellyn Haff, CNM, [facility]   Recommendations: new prescription will be sent   Follow-up: as scheduled  Pre-e signs/symptoms discussed. Advised to check bp twice daily  Jobe Marker  12/30/2022 11:44 AM

## 2023-01-02 ENCOUNTER — Encounter: Payer: Self-pay | Admitting: *Deleted

## 2023-01-04 ENCOUNTER — Encounter (HOSPITAL_COMMUNITY): Payer: Self-pay | Admitting: Obstetrics and Gynecology

## 2023-01-04 ENCOUNTER — Inpatient Hospital Stay (HOSPITAL_COMMUNITY)
Admission: AD | Admit: 2023-01-04 | Discharge: 2023-01-04 | Disposition: A | Discharge status: Home / Self Care | Payer: 59 | Attending: Obstetrics and Gynecology | Admitting: Obstetrics and Gynecology

## 2023-01-04 DIAGNOSIS — Z3A26 26 weeks gestation of pregnancy: Secondary | ICD-10-CM | POA: Diagnosis not present

## 2023-01-04 DIAGNOSIS — R519 Headache, unspecified: Secondary | ICD-10-CM | POA: Diagnosis not present

## 2023-01-04 DIAGNOSIS — O132 Gestational [pregnancy-induced] hypertension without significant proteinuria, second trimester: Secondary | ICD-10-CM | POA: Insufficient documentation

## 2023-01-04 DIAGNOSIS — O26892 Other specified pregnancy related conditions, second trimester: Secondary | ICD-10-CM | POA: Diagnosis not present

## 2023-01-04 LAB — PROTEIN / CREATININE RATIO, URINE
Creatinine, Urine: 19 mg/dL
Total Protein, Urine: <6 mg/dL

## 2023-01-04 LAB — URINALYSIS, ROUTINE W REFLEX MICROSCOPIC
Bilirubin Urine: NEGATIVE
Glucose, UA: NEGATIVE mg/dL
Hgb urine dipstick: NEGATIVE
Ketones, ur: NEGATIVE mg/dL
Leukocytes,Ua: NEGATIVE
Nitrite: NEGATIVE
Protein, ur: NEGATIVE mg/dL
Specific Gravity, Urine: 1.004 — ABNORMAL LOW (ref 1.005–1.030)
pH: 7 (ref 5.0–8.0)

## 2023-01-04 LAB — COMPREHENSIVE METABOLIC PANEL
ALT: 37 U/L (ref 0–44)
AST: 23 U/L (ref 15–41)
Albumin: 3.3 g/dL — ABNORMAL LOW (ref 3.5–5.0)
Alkaline Phosphatase: 59 U/L (ref 38–126)
Anion gap: 13 (ref 5–15)
BUN: 13 mg/dL (ref 6–20)
CO2: 22 mmol/L (ref 22–32)
Calcium: 8.7 mg/dL — ABNORMAL LOW (ref 8.9–10.3)
Chloride: 100 mmol/L (ref 98–111)
Creatinine, Ser: 0.57 mg/dL (ref 0.44–1.00)
GFR, Estimated: >60 mL/min (ref >60)
Glucose, Bld: 75 mg/dL (ref 70–99)
Potassium: 3.8 mmol/L (ref 3.5–5.1)
Sodium: 135 mmol/L (ref 135–145)
Total Bilirubin: 0.3 mg/dL (ref 0.3–1.2)
Total Protein: 6 g/dL — ABNORMAL LOW (ref 6.5–8.1)

## 2023-01-04 LAB — CBC
HCT: 37.2 % (ref 36.0–46.0)
Hemoglobin: 12.4 g/dL (ref 12.0–15.0)
MCH: 31.8 pg (ref 26.0–34.0)
MCHC: 33.3 g/dL (ref 30.0–36.0)
MCV: 95.4 fL (ref 80.0–100.0)
Platelets: 187 10*3/uL (ref 150–400)
RBC: 3.9 MIL/uL (ref 3.87–5.11)
RDW: 12.5 % (ref 11.5–15.5)
WBC: 10.9 10*3/uL — ABNORMAL HIGH (ref 4.0–10.5)
nRBC: 0 % (ref 0.0–0.2)

## 2023-01-04 MED ORDER — BUTALBITAL-APAP-CAFFEINE 50-325-40 MG PO TABS: 1.0000 | ORAL_TABLET | Freq: Four times a day (QID) | ORAL | 0 refills | Status: DC | PRN

## 2023-01-04 MED ORDER — NIFEDIPINE ER OSMOTIC RELEASE 30 MG PO TB24
30.0000 mg | ORAL_TABLET | Freq: Every day | ORAL | 3 refills | Status: DC
Start: 2023-01-04 — End: 2023-03-27

## 2023-01-04 MED ORDER — ACETAMINOPHEN 500 MG PO TABS: 1000.0000 mg | ORAL_TABLET | Freq: Once | ORAL | Status: DC

## 2023-01-04 MED ORDER — BUTALBITAL-APAP-CAFFEINE 50-325-40 MG PO TABS
2.0000 | ORAL_TABLET | Freq: Once | ORAL | Status: AC
  Administered 2023-01-04: 2 via ORAL
  Filled 2023-01-04: qty 2

## 2023-01-04 NOTE — MAU Provider Note (Signed)
Chief Complaint  Patient presents with   Hypertension   Headache     Event Date/Time   First Provider Initiated Contact with Patient 01/04/23 1749      S: [place] Anschutz  is a 22 y.o. y.o. year old G84P0000 female at [redacted]w[redacted]d weeks gestation who presents to MAU with elevated blood pressures. *** Hx HTN. Current blood pressure medication: ***  Associated symptoms: ***Headache, ***vision changes, ***epigastric pain Contractions: *** Vaginal bleeding: *** Fetal movement: ***  O: Patient Vitals for the past 24 hrs:  BP Temp Temp src Pulse Resp SpO2 Height Weight  01/04/23 1743 119/71 97.9 F (36.6 C) Oral 95 17 98 % -- --  01/04/23 1717 124/84 98.3 F (36.8 C) Oral 98 20 100 % 5\' 1"  (1.549 m) 129.4 kg   General: NAD Heart: Regular rate Lungs: Normal rate and effort Abd: Soft, NT, Gravid, S=D Extremities: ***Pedal edema Neuro: 2+ deep tendon reflexes, No clonus Pelvic: NEFG, no bleeding or LOF.      EFM: ***, Moderate variability, 15 x 15 accelerations, no decelerations Toco: Q***, ***  No results found for this or any previous visit (from the past 24 hour(s)).  MAU Course Orders Placed This Encounter  Procedures   CBC    Standing Status:   Standing    Number of Occurrences:   1   Comprehensive metabolic panel    Standing Status:   Standing    Number of Occurrences:   1   Urinalysis, Routine w reflex microscopic -Urine, Clean Catch    Standing Status:   Standing    Number of Occurrences:   1    Order Specific Question:   Specimen Source    Answer:   Urine, Clean Catch [76]   Protein / creatinine ratio, urine    Standing Status:   Standing    Number of Occurrences:   1   Vital signs    Standing Status:   Standing    Number of Occurrences:   1   Meds ordered this encounter  Medications   acetaminophen (TYLENOL) tablet 1,000 mg     MDM   A: [redacted]w[redacted]d week IUP *** Preeclampsia/Gestational hypertension/Chronic hypertension/chronic hypertension with superimposed  preeclampsia FHR reactive  P: Discharge home in stable condition per consult with Warden Fillers, MD./Admit to *** per consult with Warden Fillers, MD. Preeclampsia precautions. Follow-up for blood pressure check in *** days at your doctor's office sooner as needed if symptoms worsen. Return to maternity admissions as needed in emergencies  [place], [place], [place] 01/04/2023 5:49 PM

## 2023-01-04 NOTE — Discharge Instructions (Signed)
Insight Timer App for guided relaxation

## 2023-01-04 NOTE — MAU Note (Signed)
.  Norma Aguilar is a 22 y.o. at [redacted]w[redacted]d here in MAU reporting: HA and elevated pressures. She reports she woke up around 0530 with a HA and still went to work. She reports her HA did not go away so she had her fiance bring her blood pressure cuff to work and it was 145/85. Was Rx Labetalol 200 mg BID on 8/26 in office. Denies visual disturbances, RUQ/epigastric pain, and edema. Denies VB or LOF. +FM.   -Patient very anxious and tearful. -Last dose of Labetalol at 0620. -GHTN. -Seen in MAU on 8/21. P:C Ratio below reportable range. Platelets 226. Liver enzymes WNL.  Onset of complaint: Today Pain score: 7/10 HA   FHT: 145 initial external Lab orders placed from triage: UA

## 2023-01-07 IMAGING — US US PELVIS COMPLETE TRANSABD/TRANSVAG W DUPLEX
1 series · 13 of 25 positions shown · non-contrast
Comparison: CT of same day.

CLINICAL DATA: Acute pelvic pain.

EXAM:
TRANSABDOMINAL AND TRANSVAGINAL ULTRASOUND OF PELVIS
DOPPLER ULTRASOUND OF OVARIES
TECHNIQUE: Both transabdominal and transvaginal ultrasound examinations of the
pelvis were performed. Transabdominal technique was performed for
global imaging of the pelvis including uterus, ovaries, adnexal
regions, and pelvic cul-de-sac.
It was necessary to proceed with endovaginal exam following the
transabdominal exam to visualize the endometrium and ovaries. Color
and duplex Doppler ultrasound was utilized to evaluate blood flow to
the ovaries.

[Series 1: us pelvic complete w transvaginal and torsion righ · 13 of 61 slices shown]
[im 1/61]
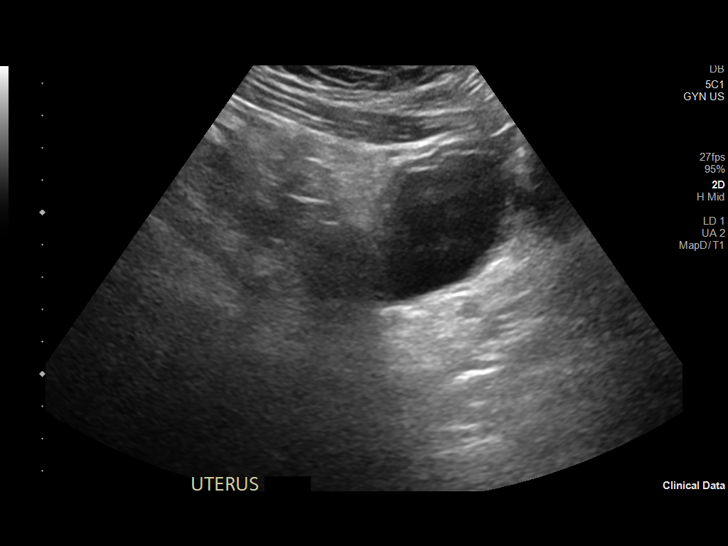
[im 6/61]
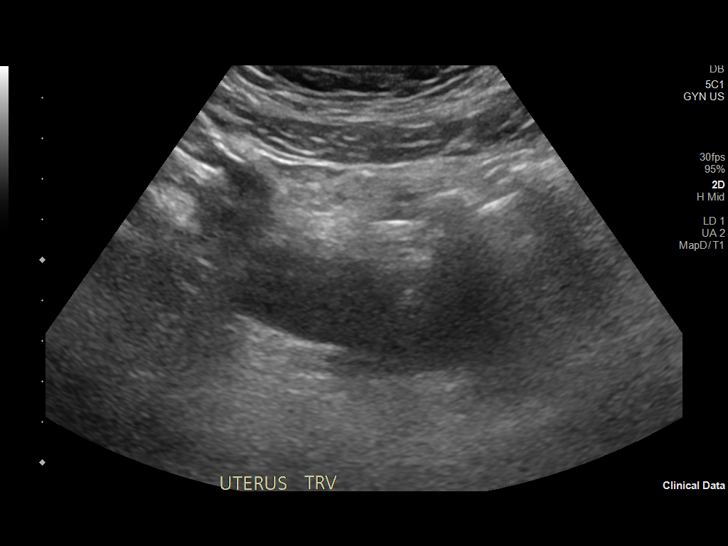
[im 11/61]
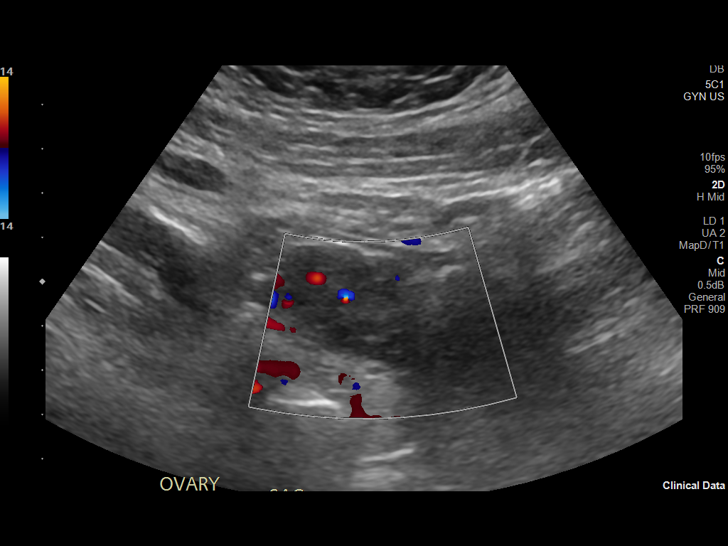
[im 16/61]
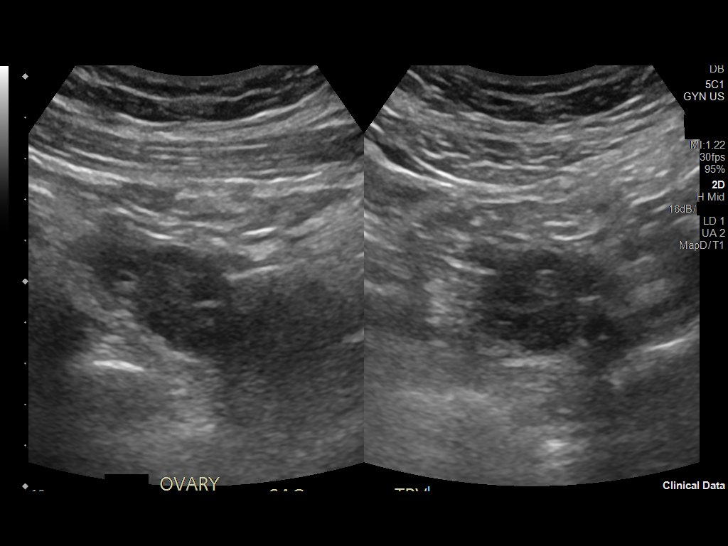
[im 21/61]
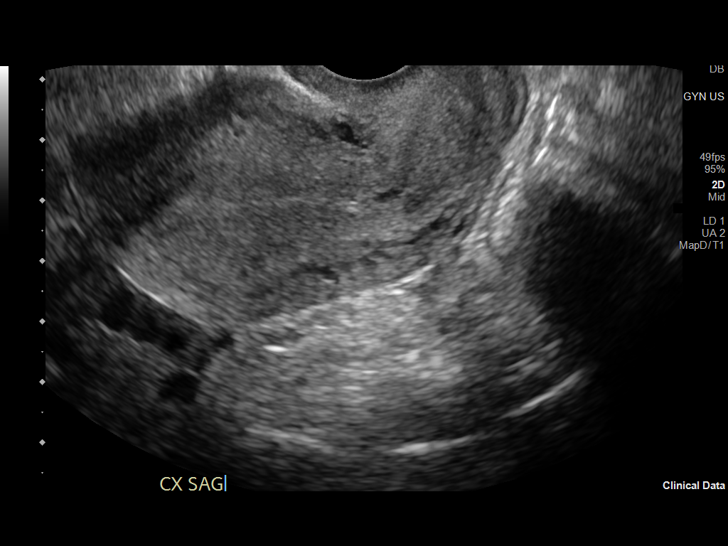
[im 26/61]
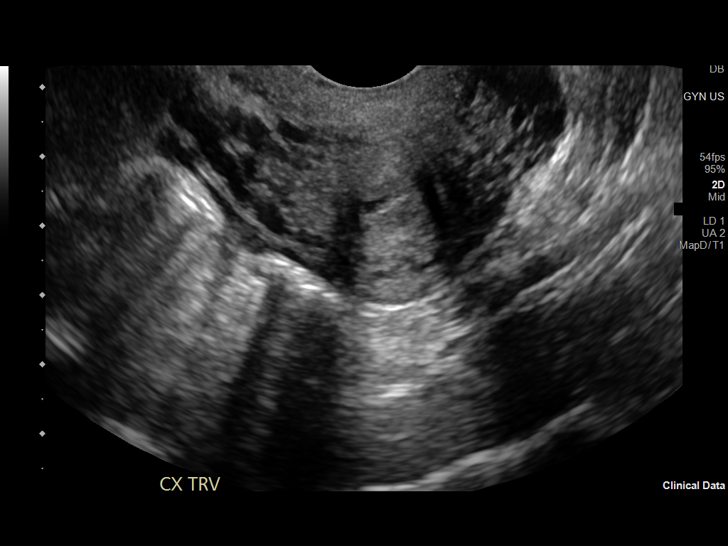
[im 31/61]
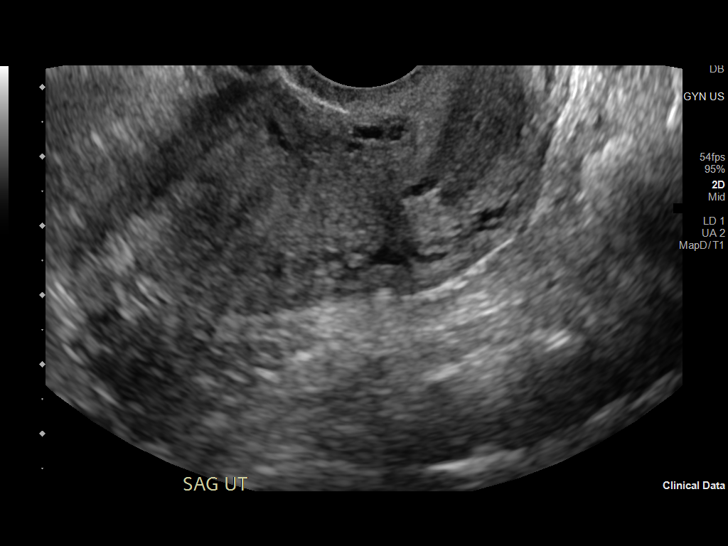
[im 36/61]
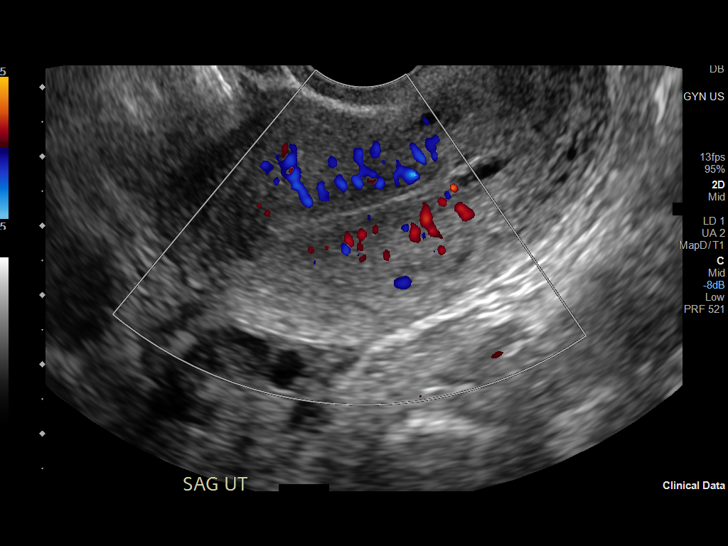
[im 41/61]
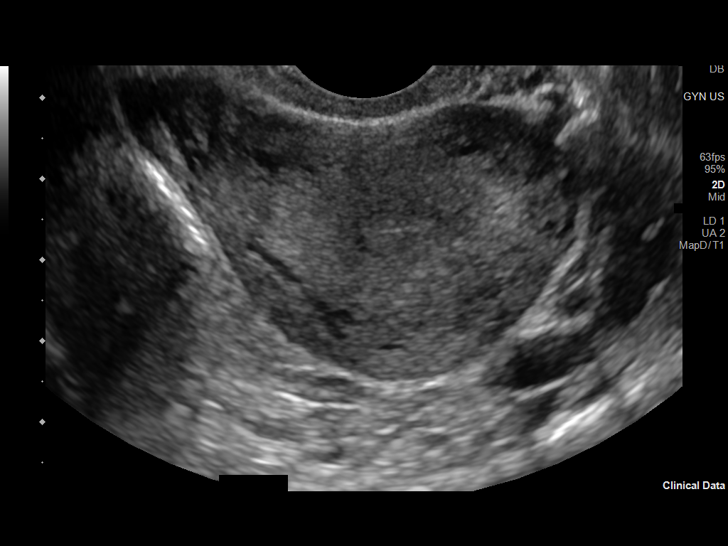
[im 46/61]
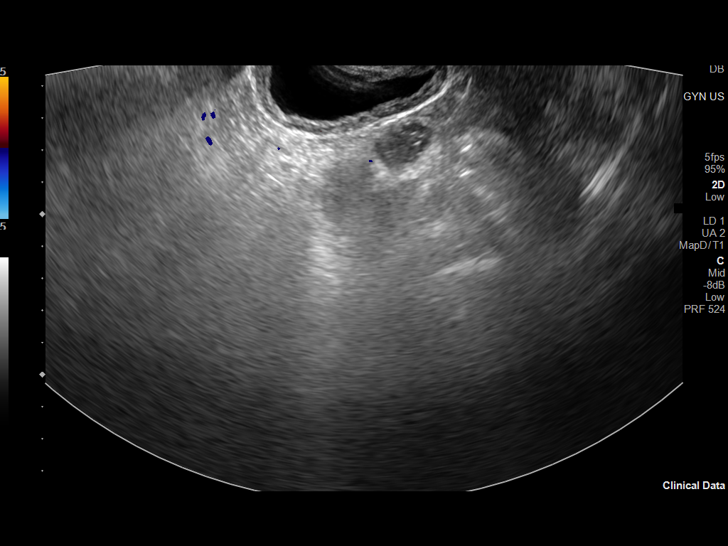
[im 51/61]
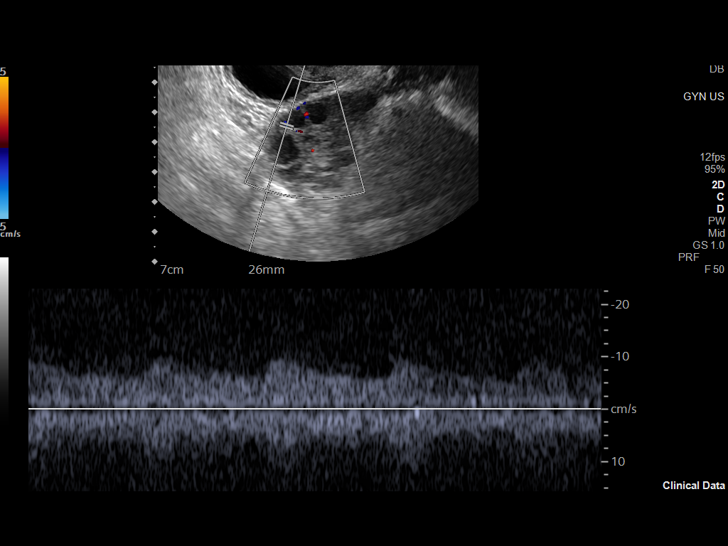
[im 56/61]
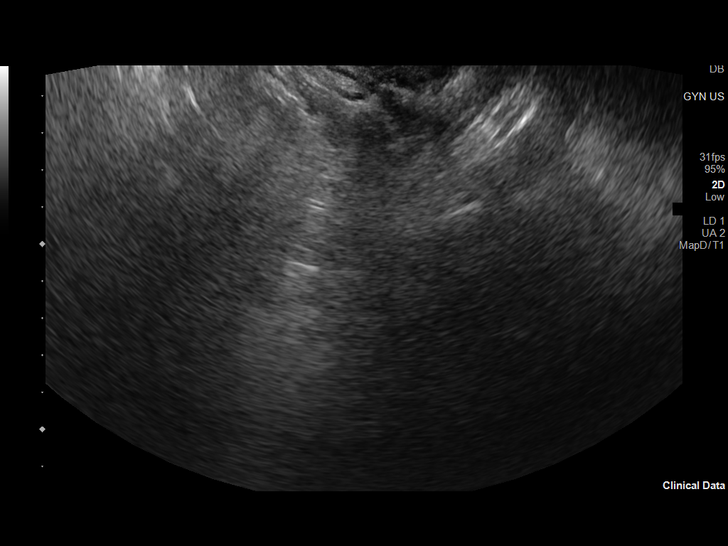
[im 61/61]
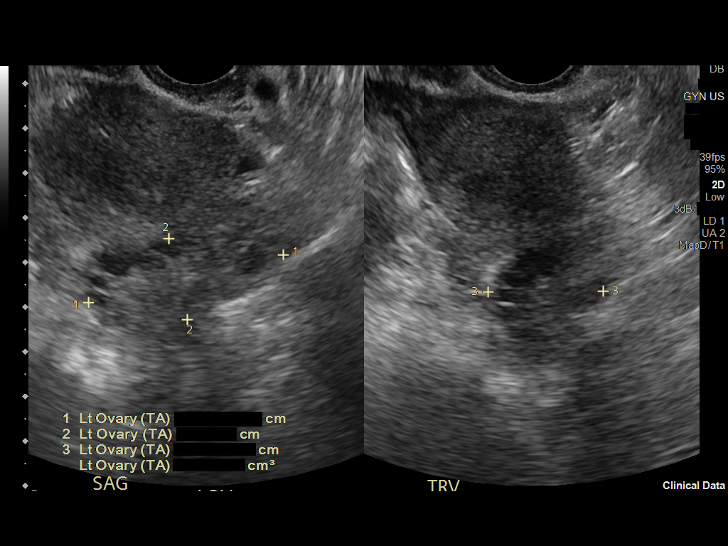

[13 of 25 positions shown; findings below may reference images not displayed]

FINDINGS: Uterus

Measurements: 6.7 x 4.4 x 3.9 cm = volume: 61 mL. Nabothian cysts
are seen in the cervix.

Endometrium

Thickness: 7 mm which is within normal limits. No focal abnormality
visualized.

Right ovary

Measurements: 4.5 x 3.2 x 2.9 cm = volume: 22 mL. Normal
appearance/no adnexal mass.

Left ovary

Measurements: 4.5 x 2.6 x 1.9 cm = volume: 11 mL. Normal
appearance/no adnexal mass.

Pulsed Doppler evaluation of both ovaries demonstrates normal
low-resistance arterial and venous waveforms.

Other findings

No abnormal free fluid.
IMPRESSION: No significant abnormality seen in the pelvis.

## 2023-01-16 ENCOUNTER — Ambulatory Visit (INDEPENDENT_AMBULATORY_CARE_PROVIDER_SITE_OTHER): Payer: 59 | Admitting: Women's Health

## 2023-01-16 ENCOUNTER — Encounter: Payer: Self-pay | Admitting: Women's Health

## 2023-01-16 ENCOUNTER — Other Ambulatory Visit: Payer: 59

## 2023-01-16 VITALS — BP 135/89 | HR 97 | Wt 189.0 lb

## 2023-01-16 DIAGNOSIS — E282 Polycystic ovarian syndrome: Secondary | ICD-10-CM | POA: Insufficient documentation

## 2023-01-16 DIAGNOSIS — Z23 Encounter for immunization: Secondary | ICD-10-CM | POA: Diagnosis not present

## 2023-01-16 DIAGNOSIS — O0990 Supervision of high risk pregnancy, unspecified, unspecified trimester: Secondary | ICD-10-CM

## 2023-01-16 DIAGNOSIS — O0992 Supervision of high risk pregnancy, unspecified, second trimester: Secondary | ICD-10-CM

## 2023-01-16 DIAGNOSIS — Z3A26 26 weeks gestation of pregnancy: Secondary | ICD-10-CM

## 2023-01-16 DIAGNOSIS — Z3A27 27 weeks gestation of pregnancy: Secondary | ICD-10-CM

## 2023-01-16 DIAGNOSIS — O132 Gestational [pregnancy-induced] hypertension without significant proteinuria, second trimester: Secondary | ICD-10-CM

## 2023-01-16 DIAGNOSIS — Z131 Encounter for screening for diabetes mellitus: Secondary | ICD-10-CM

## 2023-01-16 LAB — POCT URINALYSIS DIPSTICK OB
Blood, UA: NEGATIVE
Glucose, UA: NEGATIVE
Ketones, UA: NEGATIVE
Leukocytes, UA: NEGATIVE
Nitrite, UA: NEGATIVE
POC,PROTEIN,UA: NEGATIVE

## 2023-01-16 NOTE — Progress Notes (Signed)
HIGH-RISK PREGNANCY VISIT Patient name: Norma Aguilar MRN [id number]  Date of birth: Nov 18, 2000 Chief Complaint:   Routine Prenatal Visit (PN2 today)  History of Present Illness:   Norma Gilcrest is a 22 y.o. G19P0000 female at [redacted]w[redacted]d with an Estimated Date of Delivery: 04/12/23 being seen today for ongoing management of a high-risk pregnancy complicated by gestational hypertension dx @ 25wks currently on nifedipine 30mg  daily.    Today she reports  some headaches, fioricet helps some. . Denies visual changes, ruq/epigastric pain, n/v.   Contractions: Irritability (left side cramp). Vag. Bleeding: None.  Movement: Present. denies leaking of fluid.      10/01/2022    3:25 PM 07/18/2022   10:22 AM 12/07/2015    1:48 PM 01/19/2014    2:01 PM  Depression screen PHQ 2/9  Decreased Interest 0 0    Down, Depressed, Hopeless 0 0    PHQ - 2 Score 0 0    Altered sleeping 3 0    Tired, decreased energy 3 0    Change in appetite 3 0    Feeling bad or failure about yourself  0 0    Trouble concentrating 1 0    Moving slowly or fidgety/restless 0 0    Suicidal thoughts 0 0    PHQ-9 Score 10 0       Information is confidential and restricted. Go to Review Flowsheets to unlock data.        10/01/2022    3:25 PM 07/18/2022   10:22 AM 12/07/2015    1:50 PM  GAD 7 : Generalized Anxiety Score  Nervous, Anxious, on Edge 2 1   Control/stop worrying 1 1   Worry too much - different things 1 0   Trouble relaxing 0 0   Restless 0 0   Easily annoyed or irritable 2 0   Afraid - awful might happen 1 0   Total GAD 7 Score 7 2   Anxiety Difficulty        Information is confidential and restricted. Go to Review Flowsheets to unlock data.     Review of Systems:   Pertinent items are noted in HPI Denies abnormal vaginal discharge w/ itching/odor/irritation, headaches, visual changes, shortness of breath, chest pain, abdominal pain, severe nausea/vomiting, or problems with urination or bowel movements  unless otherwise stated above. Pertinent History Reviewed:  Reviewed past medical,surgical, social, obstetrical and family history.  Reviewed problem list, medications and allergies. Physical Assessment:   Vitals:   01/16/23 0844  BP: 135/89  Pulse: 97  Weight: 189 lb (85.7 kg)  Body mass index is 35.71 kg/m.           Physical Examination:   General appearance: alert, well appearing, and in no distress  Mental status: alert, oriented to person, place, and time  Skin: warm & dry   Extremities: Edema: None    Cardiovascular: normal heart rate noted  Respiratory: normal respiratory effort, no distress  Abdomen: gravid, soft, non-tender  Pelvic: Cervical exam deferred         Fetal Status: Fetal Heart Rate (bpm): 162 Fundal Height: 27 cm Movement: Present    Fetal Surveillance Testing today: doppler   Chaperone: N/A    Results for orders placed or performed in visit on 01/16/23 (from the past 24 hour(s))  POC Urinalysis Dipstick OB   Collection Time: 01/16/23  9:17 AM  Result Value Ref Range   Color, UA     Clarity, UA  Glucose, UA Negative Negative   Bilirubin, UA     Ketones, UA Negative    Spec Grav, UA     Blood, UA Negative    pH, UA     POC,PROTEIN,UA Negative Negative, Trace, Small (1+), Moderate (2+), Large (3+), 4+   Urobilinogen, UA     Nitrite, UA Negative    Leukocytes, UA Negative Negative   Appearance     Odor      Assessment & Plan:  High-risk pregnancy: G1P0000 at [redacted]w[redacted]d with an Estimated Date of Delivery: 04/12/23   1) GHTN dx @ 25wks, stable on nifedipine 30mg  daily, labs 8/31 wnl. Reviewed pre-e s/s, reasons to seek care  Meds: No orders of the defined types were placed in this encounter.   Labs/procedures today: tdap and PN2  Treatment Plan:  [place] q 4wks     Weekly BPPs @ 28wks, add NST @ 32wk           Deliver @ 37-37.6wks:___   Reviewed: Preterm labor symptoms and general obstetric precautions including but not limited to vaginal  bleeding, contractions, leaking of fluid and fetal movement were reviewed in detail with the patient.  All questions were answered. Does have home bp cuff. Office bp cuff given: not applicable. Check bp daily, let us know if consistently >140 and/or >90.  Follow-up: Return for begin weekly bpp/HROB next week, add NST/nurse @ 32wks.   Future Appointments  Date Time Provider Department Center  01/21/2023  1:30 PM [facility] - [facility] [place] [facility] None  01/21/2023  2:30 PM Cheral Marker, CNM [facility]  01/27/2023 10:00 AM [facility] - [facility]YN [place] [facility] None  01/27/2023 11:10 AM Cheral Marker, CNM [facility]  02/04/2023  3:00 PM [facility] - [facility]YN [place] [facility] None  02/04/2023  3:50 PM Lazaro Arms, MD [facility]  02/14/2023 11:30 AM [facility] NURSE [facility]    Orders Placed This Encounter  Procedures   US OB Follow Up   US FETAL BPP WO NON STRESS   [place] UA Cord Doppler   Tdap vaccine greater than or equal to 7yo IM   POC Urinalysis Dipstick OB   Cheral Marker CNM, [facility] 01/16/2023 9:25 AM

## 2023-01-16 NOTE — Patient Instructions (Signed)
[place], thank you for choosing our office today! We appreciate the opportunity to meet your healthcare needs. You may receive a short survey by mail, e-mail, or through [employer]. If you are happy with your care we would appreciate if you could take just a few minutes to complete the survey questions. We read all of your comments and take your feedback very seriously. Thank you again for choosing our office.  Center for [employer] Team at [facility]  [facility] at [facility] ([address] [place], [place] [address]) Entrance C, located off of [address] Free 24/7 valet parking   CLASSES: Go to [email or web address] to register for classes (childbirth, breastfeeding, waterbirth, infant CPR, daddy bootcamp, etc.)  Call the office [phone]) or go to [facility] if: You begin to have strong, frequent contractions Your water breaks.  Sometimes it is a big gush of fluid, sometimes it is just a trickle that keeps getting your panties wet or running down your legs You have vaginal bleeding.  It is normal to have a small amount of spotting if your cervix was checked.  You don't feel your baby moving like normal.  If you don't, get you something to eat and drink and lay down and focus on feeling your baby move.   If your baby is still not moving like normal, you should call the office or go to [facility].  Call the office [phone]) or go to [facility] hospital for these signs of pre-eclampsia: Severe headache that does not go away with Tylenol Visual changes- seeing spots, double, blurred vision Pain under your right breast or upper abdomen that does not go away with Tums or heartburn medicine Nausea and/or vomiting Severe swelling in your hands, feet, and face   Tdap Vaccine It is recommended that you get the Tdap vaccine during the third trimester of EACH pregnancy to help protect your baby from getting pertussis (whooping cough) 27-36 weeks is the BEST time to do  this so that you can pass the protection on to your baby. During pregnancy is better than after pregnancy, but if you are unable to get it during pregnancy it will be offered at the hospital.  You can get this vaccine with [place], at the health department, your family doctor, or some local pharmacies Everyone who will be around your baby should also be up-to-date on their vaccines before the baby comes. Adults (who are not pregnant) only need 1 dose of Tdap during adulthood.   [facility] Pediatricians/Family Doctors [facility]): [address] Colette Ribas, [phone]           [facility]: [address], [phone]                [facility]): [address], [phone] (call to ask if accepting patients) [facility] Department: [address], [place], [phone]    [facility] Pediatricians/Family Doctors [facility]): [phone] [address], [phone] [facility]: [address] [place], [phone] [facility]: [address], [phone]  [facility] Doctors  [facility]): [phone] [facility]: [address] [phone]   [facility] Doctors [facility]: [address] [phone]  [facility] Family Doctors  [facility]: [phone], [phone]  Home Blood Pressure Monitoring for Patients   Your provider has recommended that you check your  blood pressure (BP) at least once a week at home. If you do not have a blood pressure cuff at home, one will be provided for you. Contact your provider if you have not received your monitor within 1 week.   Helpful Tips for Accurate Home Blood Pressure Checks  Don't smoke, exercise, or drink caffeine 30 minutes before checking your BP Use the restroom before checking your BP (a full bladder can raise your  pressure) Relax in a comfortable upright chair Feet on the ground Left arm resting comfortably on a flat surface at the level of your heart Legs uncrossed Back supported Sit quietly and don't talk Place the cuff on your bare arm Adjust snuggly, so that only two fingertips can fit between your skin and the top of the cuff Check 2 readings separated by at least one minute Keep a log of your BP readings For a visual, please reference this diagram: http://ccnc.care/bpdiagram  Provider Name: [facility]     Phone: [phone]  Zone 1: ALL CLEAR  Continue to monitor your symptoms:  BP reading is less than 140 (top number) or less than 90 (bottom number)  No right upper stomach pain No headaches or seeing spots No feeling nauseated or throwing up No swelling in face and hands  Zone 2: CAUTION Call your doctor's office for any of the following:  BP reading is greater than 140 (top number) or greater than 90 (bottom number)  Stomach pain under your ribs in the middle or right side Headaches or seeing spots Feeling nauseated or throwing up Swelling in face and hands  Zone 3: EMERGENCY  Seek immediate medical care if you have any of the following:  BP reading is greater than160 (top number) or greater than 110 (bottom number) Severe headaches not improving with Tylenol Serious difficulty catching your breath Any worsening symptoms from Zone 2   Third Trimester of Pregnancy The third trimester is from week 29 through week 42, months 7 through 9. The third trimester is a time when the fetus is growing rapidly. At the end of the ninth month, the fetus is about 20 inches in length and weighs 6-10 pounds.  BODY CHANGES Your body goes through many changes during pregnancy. The changes vary from woman to woman.  Your weight will continue to increase. You can expect to gain 25-35 pounds (11-16 kg) by the end of the pregnancy. You may begin to get stretch marks on your hips, abdomen,  and breasts. You may urinate more often because the fetus is moving lower into your pelvis and pressing on your bladder. You may develop or continue to have heartburn as a result of your pregnancy. You may develop constipation because certain hormones are causing the muscles that push waste through your intestines to slow down. You may develop hemorrhoids or swollen, bulging veins (varicose veins). You may have pelvic pain because of the weight gain and pregnancy hormones relaxing your joints between the bones in your pelvis. Backaches may result from overexertion of the muscles supporting your posture. You may have changes in your hair. These can include thickening of your hair, rapid growth, and changes in texture. Some women also have hair loss during or after pregnancy, or hair that feels dry or thin. Your hair will most likely return to normal after your baby is born. Your breasts will continue to grow and be tender. A yellow discharge may leak from your breasts called colostrum. Your belly button may stick out. You may  feel short of breath because of your expanding uterus. You may notice the fetus "dropping," or moving lower in your abdomen. You may have a bloody mucus discharge. This usually occurs a few days to a week before labor begins. Your cervix becomes thin and soft (effaced) near your due date. WHAT TO EXPECT AT YOUR PRENATAL EXAMS  You will have prenatal exams every 2 weeks until week 36. Then, you will have weekly prenatal exams. During a routine prenatal visit: You will be weighed to make sure you and the fetus are growing normally. Your blood pressure is taken. Your abdomen will be measured to track your baby's growth. The fetal heartbeat will be listened to. Any test results from the previous visit will be discussed. You may have a cervical check near your due date to see if you have effaced. At around 36 weeks, your caregiver will check your cervix. At the same time, your  caregiver will also perform a test on the secretions of the vaginal tissue. This test is to determine if a type of bacteria, Group B streptococcus, is present. Your caregiver will explain this further. Your caregiver may ask you: What your birth plan is. How you are feeling. If you are feeling the baby move. If you have had any abnormal symptoms, such as leaking fluid, bleeding, severe headaches, or abdominal cramping. If you have any questions. Other tests or screenings that may be performed during your third trimester include: Blood tests that check for low iron levels (anemia). Fetal testing to check the health, activity level, and growth of the fetus. Testing is done if you have certain medical conditions or if there are problems during the pregnancy. FALSE LABOR You may feel small, irregular contractions that eventually go away. These are called Braxton Hicks contractions, or false labor. Contractions may last for hours, days, or even weeks before true labor sets in. If contractions come at regular intervals, intensify, or become painful, it is best to be seen by your caregiver.  SIGNS OF LABOR  Menstrual-like cramps. Contractions that are 5 minutes apart or less. Contractions that start on the top of the uterus and spread down to the lower abdomen and back. A sense of increased pelvic pressure or back pain. A watery or bloody mucus discharge that comes from the vagina. If you have any of these signs before the 37th week of pregnancy, call your caregiver right away. You need to go to the hospital to get checked immediately. HOME CARE INSTRUCTIONS  Avoid all smoking, herbs, alcohol, and unprescribed drugs. These chemicals affect the formation and growth of the baby. Follow your caregiver's instructions regarding medicine use. There are medicines that are either safe or unsafe to take during pregnancy. Exercise only as directed by your caregiver. Experiencing uterine cramps is a good sign to  stop exercising. Continue to eat regular, healthy meals. Wear a good support bra for breast tenderness. Do not use hot tubs, steam rooms, or saunas. Wear your seat belt at all times when driving. Avoid raw meat, uncooked cheese, cat litter boxes, and soil used by cats. These carry germs that can cause birth defects in the baby. Take your prenatal vitamins. Try taking a stool softener (if your caregiver approves) if you develop constipation. Eat more high-fiber foods, such as fresh vegetables or fruit and whole grains. Drink plenty of fluids to keep your urine clear or pale yellow. Take warm sitz baths to soothe any pain or discomfort caused by hemorrhoids. Use hemorrhoid cream if  your caregiver approves. If you develop varicose veins, wear support hose. Elevate your feet for 15 minutes, 3-4 times a day. Limit salt in your diet. Avoid heavy lifting, wear low heal shoes, and practice good posture. Rest a lot with your legs elevated if you have leg cramps or low back pain. Visit your dentist if you have not gone during your pregnancy. Use a soft toothbrush to brush your teeth and be gentle when you floss. A sexual relationship may be continued unless your caregiver directs you otherwise. Do not travel far distances unless it is absolutely necessary and only with the approval of your caregiver. Take prenatal classes to understand, practice, and ask questions about the labor and delivery. Make a trial run to the hospital. Pack your hospital bag. Prepare the baby's nursery. Continue to go to all your prenatal visits as directed by your caregiver. SEEK MEDICAL CARE IF: You are unsure if you are in labor or if your water has broken. You have dizziness. You have mild pelvic cramps, pelvic pressure, or nagging pain in your abdominal area. You have persistent nausea, vomiting, or diarrhea. You have a bad smelling vaginal discharge. You have pain with urination. SEEK IMMEDIATE MEDICAL CARE IF:  You  have a fever. You are leaking fluid from your vagina. You have spotting or bleeding from your vagina. You have severe abdominal cramping or pain. You have rapid weight loss or gain. You have shortness of breath with chest pain. You notice sudden or extreme swelling of your face, hands, ankles, feet, or legs. You have not felt your baby move in over an hour. You have severe headaches that do not go away with medicine. You have vision changes. Document Released: 04/16/2001 Document Revised: 04/27/2013 Document Reviewed: 06/23/2012 [facility] Patient Information 2015 [place], [place]. This information is not intended to replace advice given to you by your health care provider. Make sure you discuss any questions you have with your health care provider.

## 2023-01-17 LAB — GLUCOSE TOLERANCE, 2 HOURS W/ 1HR
Glucose, 1 hour: 101 mg/dL (ref 70–179)
Glucose, 2 hour: 101 mg/dL (ref 70–152)
Glucose, Fasting: 69 mg/dL — ABNORMAL LOW (ref 70–91)

## 2023-01-17 LAB — RPR: RPR Ser Ql: NONREACTIVE

## 2023-01-17 LAB — ANTIBODY SCREEN: Antibody Screen: NEGATIVE

## 2023-01-17 LAB — HIV ANTIBODY (ROUTINE TESTING W REFLEX): HIV Screen 4th Generation wRfx: NONREACTIVE

## 2023-01-21 ENCOUNTER — Ambulatory Visit (INDEPENDENT_AMBULATORY_CARE_PROVIDER_SITE_OTHER): Payer: 59 | Admitting: Obstetrics & Gynecology

## 2023-01-21 ENCOUNTER — Encounter: Payer: Self-pay | Admitting: Obstetrics & Gynecology

## 2023-01-21 ENCOUNTER — Ambulatory Visit (INDEPENDENT_AMBULATORY_CARE_PROVIDER_SITE_OTHER): Payer: 59

## 2023-01-21 VITALS — BP 128/87 | HR 100 | Wt 192.5 lb

## 2023-01-21 DIAGNOSIS — O0992 Supervision of high risk pregnancy, unspecified, second trimester: Secondary | ICD-10-CM

## 2023-01-21 DIAGNOSIS — O132 Gestational [pregnancy-induced] hypertension without significant proteinuria, second trimester: Secondary | ICD-10-CM

## 2023-01-21 DIAGNOSIS — Z3A28 28 weeks gestation of pregnancy: Secondary | ICD-10-CM

## 2023-01-21 DIAGNOSIS — O0990 Supervision of high risk pregnancy, unspecified, unspecified trimester: Secondary | ICD-10-CM

## 2023-01-21 DIAGNOSIS — O0993 Supervision of high risk pregnancy, unspecified, third trimester: Secondary | ICD-10-CM

## 2023-01-21 DIAGNOSIS — O133 Gestational [pregnancy-induced] hypertension without significant proteinuria, third trimester: Secondary | ICD-10-CM

## 2023-01-21 LAB — POCT URINALYSIS DIPSTICK OB
Blood, UA: NEGATIVE
Glucose, UA: NEGATIVE
Ketones, UA: NEGATIVE
Leukocytes, UA: NEGATIVE
Nitrite, UA: NEGATIVE
POC,PROTEIN,UA: NEGATIVE

## 2023-01-21 NOTE — Progress Notes (Signed)
[place] 28+3 wks,frank breech,cx 4 cm,anterior placenta gr 0,AFI 18 cm,FHR 138 bpm,BPP 8/8,EFW 1127 g 18% FL 11%,RI .72,.75,.72=83%

## 2023-01-21 NOTE — Progress Notes (Signed)
HIGH-RISK PREGNANCY VISIT Patient name: Norma Aguilar MRN [id number]  Date of birth: [birth year] Chief Complaint:   Routine Prenatal Visit (Ultrasound today)  History of Present Illness:   Norma Aguilar is a 22 y.o. G87P0000 female at [redacted]w[redacted]d with an Estimated Date of Delivery: 04/12/23 being seen today for ongoing management of a high-risk pregnancy complicated by gHTN on Procardia xl 30 daily  Today she reports no complaints. Contractions: Not present. Vag. Bleeding: None.  Movement: Present. denies leaking of fluid.      10/01/2022    3:25 PM 07/18/2022   10:22 AM 12/07/2015    1:48 PM 01/19/2014    2:01 PM  Depression screen PHQ 2/9  Decreased Interest 0 0    Down, Depressed, Hopeless 0 0    PHQ - 2 Score 0 0    Altered sleeping 3 0    Tired, decreased energy 3 0    Change in appetite 3 0    Feeling bad or failure about yourself  0 0    Trouble concentrating 1 0    Moving slowly or fidgety/restless 0 0    Suicidal thoughts 0 0    PHQ-9 Score 10 0       Information is confidential and restricted. Go to Review Flowsheets to unlock data.        10/01/2022    3:25 PM 07/18/2022   10:22 AM 12/07/2015    1:50 PM  GAD 7 : Generalized Anxiety Score  Nervous, Anxious, on Edge 2 1   Control/stop worrying 1 1   Worry too much - different things 1 0   Trouble relaxing 0 0   Restless 0 0   Easily annoyed or irritable 2 0   Afraid - awful might happen 1 0   Total GAD 7 Score 7 2   Anxiety Difficulty        Information is confidential and restricted. Go to Review Flowsheets to unlock data.     Review of Systems:   Pertinent items are noted in HPI Denies abnormal vaginal discharge w/ itching/odor/irritation, headaches, visual changes, shortness of breath, chest pain, abdominal pain, severe nausea/vomiting, or problems with urination or bowel movements unless otherwise stated above. Pertinent History Reviewed:  Reviewed past medical,surgical, social, obstetrical and family history.   Reviewed problem list, medications and allergies. Physical Assessment:   Vitals:   01/21/23 1416  BP: 128/87  Pulse: 100  Weight: 192 lb 8 oz (87.3 kg)  Body mass index is 36.37 kg/m.           Physical Examination:   General appearance: alert, well appearing, and in no distress  Mental status: alert, oriented to person, place, and time  Skin: warm & dry   Extremities: Edema: Mild pitting, slight indentation    Cardiovascular: normal heart rate noted  Respiratory: normal respiratory effort, no distress  Abdomen: gravid, soft, non-tender  Pelvic: Cervical exam deferred         Fetal Status:     Movement: Present    Fetal Surveillance Testing today: BPP 8/8 EFW 18% UAD 83%   Chaperone: na    Results for orders placed or performed in visit on 01/21/23 (from the past 24 hour(s))  POC Urinalysis Dipstick OB   Collection Time: 01/21/23  2:26 PM  Result Value Ref Range   Color, UA     Clarity, UA     Glucose, UA Negative Negative   Bilirubin, UA     Ketones, UA Negative  Spec Grav, UA     Blood, UA Negative    pH, UA     POC,PROTEIN,UA Negative Negative, Trace, Small (1+), Moderate (2+), Large (3+), 4+   Urobilinogen, UA     Nitrite, UA Negative    Leukocytes, UA Negative Negative   Appearance     Odor      Assessment & Plan:  High-risk pregnancy: G1P0000 at [redacted]w[redacted]d with an Estimated Date of Delivery: 04/12/23      ICD-10-CM   1. Supervision of high risk pregnancy, antepartum  O09.90 POC Urinalysis Dipstick OB    2. Gestational hypertension: Procardia xl 30 mg  O13.3 POC Urinalysis Dipstick OB    3. [redacted] weeks gestation of pregnancy  Z3A.28 POC Urinalysis Dipstick OB       Meds: No orders of the defined types were placed in this encounter.   Orders:  Orders Placed This Encounter  Procedures   POC Urinalysis Dipstick OB     Labs/procedures today: U/S  Treatment Plan:  weekly BPP til 32 weeks then twice weekly  Reviewed: Preterm labor symptoms and general  obstetric precautions including but not limited to vaginal bleeding, contractions, leaking of fluid and fetal movement were reviewed in detail with the patient.  All questions were answered. Does have home bp cuff. Office bp cuff given: not applicable. Check bp twice daily, let us know if consistently >150 and/or >95.  Follow-up: No follow-ups on file.   Future Appointments  Date Time Provider Department Center  01/27/2023 10:00 AM [facility] - [facility] [place] [facility] None  01/27/2023 11:10 AM Cheral Marker, CNM [facility]  02/04/2023  3:00 PM [facility] - [facility] [place] [facility] None  02/04/2023  3:50 PM Lazaro Arms, MD [facility]  02/14/2023 11:30 AM [facility] NURSE [facility]    Orders Placed This Encounter  Procedures   POC Urinalysis Dipstick OB   Lazaro Arms  Attending Physician for the Center for [facility] 01/21/2023 3:36 PM

## 2023-01-23 ENCOUNTER — Other Ambulatory Visit: Payer: 59

## 2023-01-23 ENCOUNTER — Encounter: Payer: 59 | Admitting: Women's Health

## 2023-01-27 ENCOUNTER — Encounter: Payer: 59 | Admitting: Women's Health

## 2023-01-27 ENCOUNTER — Encounter: Payer: Self-pay | Admitting: Women's Health

## 2023-01-27 ENCOUNTER — Other Ambulatory Visit: Payer: 59

## 2023-01-27 ENCOUNTER — Ambulatory Visit (INDEPENDENT_AMBULATORY_CARE_PROVIDER_SITE_OTHER): Payer: 59

## 2023-01-27 ENCOUNTER — Ambulatory Visit (INDEPENDENT_AMBULATORY_CARE_PROVIDER_SITE_OTHER): Payer: 59 | Admitting: Women's Health

## 2023-01-27 VITALS — BP 138/84 | HR 97 | Wt 192.0 lb

## 2023-01-27 DIAGNOSIS — O0993 Supervision of high risk pregnancy, unspecified, third trimester: Secondary | ICD-10-CM | POA: Diagnosis not present

## 2023-01-27 DIAGNOSIS — Z3A29 29 weeks gestation of pregnancy: Secondary | ICD-10-CM

## 2023-01-27 DIAGNOSIS — O0990 Supervision of high risk pregnancy, unspecified, unspecified trimester: Secondary | ICD-10-CM

## 2023-01-27 DIAGNOSIS — O133 Gestational [pregnancy-induced] hypertension without significant proteinuria, third trimester: Secondary | ICD-10-CM

## 2023-01-27 DIAGNOSIS — O132 Gestational [pregnancy-induced] hypertension without significant proteinuria, second trimester: Secondary | ICD-10-CM

## 2023-01-27 DIAGNOSIS — O0992 Supervision of high risk pregnancy, unspecified, second trimester: Secondary | ICD-10-CM

## 2023-01-27 LAB — POCT URINALYSIS DIPSTICK OB
Blood, UA: NEGATIVE
Glucose, UA: NEGATIVE
Ketones, UA: NEGATIVE
Nitrite, UA: NEGATIVE
POC,PROTEIN,UA: NEGATIVE

## 2023-01-27 NOTE — Patient Instructions (Signed)
[place], thank you for choosing our office today! We appreciate the opportunity to meet your healthcare needs. You may receive a short survey by mail, e-mail, or through [employer]. If you are happy with your care we would appreciate if you could take just a few minutes to complete the survey questions. We read all of your comments and take your feedback very seriously. Thank you again for choosing our office.  Center for [employer] Team at [facility]  [facility] at [facility] ([address] [place], [place] [address]) Entrance C, located off of [address] Free 24/7 valet parking   CLASSES: Go to [email or web address] to register for classes (childbirth, breastfeeding, waterbirth, infant CPR, daddy bootcamp, etc.)  Call the office [phone]) or go to [facility] if: You begin to have strong, frequent contractions Your water breaks.  Sometimes it is a big gush of fluid, sometimes it is just a trickle that keeps getting your panties wet or running down your legs You have vaginal bleeding.  It is normal to have a small amount of spotting if your cervix was checked.  You don't feel your baby moving like normal.  If you don't, get you something to eat and drink and lay down and focus on feeling your baby move.   If your baby is still not moving like normal, you should call the office or go to [facility].  Call the office [phone]) or go to [facility] hospital for these signs of pre-eclampsia: Severe headache that does not go away with Tylenol Visual changes- seeing spots, double, blurred vision Pain under your right breast or upper abdomen that does not go away with Tums or heartburn medicine Nausea and/or vomiting Severe swelling in your hands, feet, and face   Tdap Vaccine It is recommended that you get the Tdap vaccine during the third trimester of EACH pregnancy to help protect your baby from getting pertussis (whooping cough) 27-36 weeks is the BEST time to do  this so that you can pass the protection on to your baby. During pregnancy is better than after pregnancy, but if you are unable to get it during pregnancy it will be offered at the hospital.  You can get this vaccine with [place], at the health department, your family doctor, or some local pharmacies Everyone who will be around your baby should also be up-to-date on their vaccines before the baby comes. Adults (who are not pregnant) only need 1 dose of Tdap during adulthood.   [facility] Pediatricians/Family Doctors [facility]): [address] [place], [phone]           [facility]: [address], [phone]                [facility]): [address], [phone] (call to ask if accepting patients) [facility] Department: [address], [place], [phone]    [facility] Pediatricians/Family Doctors [facility]): [phone] [address], [phone] [facility]: [address] [place], [phone] [facility]: [address], [phone]  [facility] Doctors  [facility]): [phone] [facility]: [address] [phone]   [facility] Doctors [facility]: [address] [phone]  [facility] Family Doctors  [facility]: [phone], [phone]  Home Blood Pressure Monitoring for Patients   Your provider has recommended that you check your  blood pressure (BP) at least once a week at home. If you do not have a blood pressure cuff at home, one will be provided for you. Contact your provider if you have not received your monitor within 1 week.   Helpful Tips for Accurate Home Blood Pressure Checks  Don't smoke, exercise, or drink caffeine 30 minutes before checking your BP Use the restroom before checking your BP (a full bladder can raise your  pressure) Relax in a comfortable upright chair Feet on the ground Left arm resting comfortably on a flat surface at the level of your heart Legs uncrossed Back supported Sit quietly and don't talk Place the cuff on your bare arm Adjust snuggly, so that only two fingertips can fit between your skin and the top of the cuff Check 2 readings separated by at least one minute Keep a log of your BP readings For a visual, please reference this diagram: http://ccnc.care/bpdiagram  Provider Name: [facility]     Phone: [phone]  Zone 1: ALL CLEAR  Continue to monitor your symptoms:  BP reading is less than 140 (top number) or less than 90 (bottom number)  No right upper stomach pain No headaches or seeing spots No feeling nauseated or throwing up No swelling in face and hands  Zone 2: CAUTION Call your doctor's office for any of the following:  BP reading is greater than 140 (top number) or greater than 90 (bottom number)  Stomach pain under your ribs in the middle or right side Headaches or seeing spots Feeling nauseated or throwing up Swelling in face and hands  Zone 3: EMERGENCY  Seek immediate medical care if you have any of the following:  BP reading is greater than160 (top number) or greater than 110 (bottom number) Severe headaches not improving with Tylenol Serious difficulty catching your breath Any worsening symptoms from Zone 2   Third Trimester of Pregnancy The third trimester is from week 29 through week 42, months 7 through 9. The third trimester is a time when the fetus is growing rapidly. At the end of the ninth month, the fetus is about 20 inches in length and weighs 6-10 pounds.  BODY CHANGES Your body goes through many changes during pregnancy. The changes vary from woman to woman.  Your weight will continue to increase. You can expect to gain 25-35 pounds (11-16 kg) by the end of the pregnancy. You may begin to get stretch marks on your hips, abdomen,  and breasts. You may urinate more often because the fetus is moving lower into your pelvis and pressing on your bladder. You may develop or continue to have heartburn as a result of your pregnancy. You may develop constipation because certain hormones are causing the muscles that push waste through your intestines to slow down. You may develop hemorrhoids or swollen, bulging veins (varicose veins). You may have pelvic pain because of the weight gain and pregnancy hormones relaxing your joints between the bones in your pelvis. Backaches may result from overexertion of the muscles supporting your posture. You may have changes in your hair. These can include thickening of your hair, rapid growth, and changes in texture. Some women also have hair loss during or after pregnancy, or hair that feels dry or thin. Your hair will most likely return to normal after your baby is born. Your breasts will continue to grow and be tender. A yellow discharge may leak from your breasts called colostrum. Your belly button may stick out. You may  feel short of breath because of your expanding uterus. You may notice the fetus "dropping," or moving lower in your abdomen. You may have a bloody mucus discharge. This usually occurs a few days to a week before labor begins. Your cervix becomes thin and soft (effaced) near your due date. WHAT TO EXPECT AT YOUR PRENATAL EXAMS  You will have prenatal exams every 2 weeks until week 36. Then, you will have weekly prenatal exams. During a routine prenatal visit: You will be weighed to make sure you and the fetus are growing normally. Your blood pressure is taken. Your abdomen will be measured to track your baby's growth. The fetal heartbeat will be listened to. Any test results from the previous visit will be discussed. You may have a cervical check near your due date to see if you have effaced. At around 36 weeks, your caregiver will check your cervix. At the same time, your  caregiver will also perform a test on the secretions of the vaginal tissue. This test is to determine if a type of bacteria, Group B streptococcus, is present. Your caregiver will explain this further. Your caregiver may ask you: What your birth plan is. How you are feeling. If you are feeling the baby move. If you have had any abnormal symptoms, such as leaking fluid, bleeding, severe headaches, or abdominal cramping. If you have any questions. Other tests or screenings that may be performed during your third trimester include: Blood tests that check for low iron levels (anemia). Fetal testing to check the health, activity level, and growth of the fetus. Testing is done if you have certain medical conditions or if there are problems during the pregnancy. FALSE LABOR You may feel small, irregular contractions that eventually go away. These are called Braxton Hicks contractions, or false labor. Contractions may last for hours, days, or even weeks before true labor sets in. If contractions come at regular intervals, intensify, or become painful, it is best to be seen by your caregiver.  SIGNS OF LABOR  Menstrual-like cramps. Contractions that are 5 minutes apart or less. Contractions that start on the top of the uterus and spread down to the lower abdomen and back. A sense of increased pelvic pressure or back pain. A watery or bloody mucus discharge that comes from the vagina. If you have any of these signs before the 37th week of pregnancy, call your caregiver right away. You need to go to the hospital to get checked immediately. HOME CARE INSTRUCTIONS  Avoid all smoking, herbs, alcohol, and unprescribed drugs. These chemicals affect the formation and growth of the baby. Follow your caregiver's instructions regarding medicine use. There are medicines that are either safe or unsafe to take during pregnancy. Exercise only as directed by your caregiver. Experiencing uterine cramps is a good sign to  stop exercising. Continue to eat regular, healthy meals. Wear a good support bra for breast tenderness. Do not use hot tubs, steam rooms, or saunas. Wear your seat belt at all times when driving. Avoid raw meat, uncooked cheese, cat litter boxes, and soil used by cats. These carry germs that can cause birth defects in the baby. Take your prenatal vitamins. Try taking a stool softener (if your caregiver approves) if you develop constipation. Eat more high-fiber foods, such as fresh vegetables or fruit and whole grains. Drink plenty of fluids to keep your urine clear or pale yellow. Take warm sitz baths to soothe any pain or discomfort caused by hemorrhoids. Use hemorrhoid cream if  your caregiver approves. If you develop varicose veins, wear support hose. Elevate your feet for 15 minutes, 3-4 times a day. Limit salt in your diet. Avoid heavy lifting, wear low heal shoes, and practice good posture. Rest a lot with your legs elevated if you have leg cramps or low back pain. Visit your dentist if you have not gone during your pregnancy. Use a soft toothbrush to brush your teeth and be gentle when you floss. A sexual relationship may be continued unless your caregiver directs you otherwise. Do not travel far distances unless it is absolutely necessary and only with the approval of your caregiver. Take prenatal classes to understand, practice, and ask questions about the labor and delivery. Make a trial run to the hospital. Pack your hospital bag. Prepare the baby's nursery. Continue to go to all your prenatal visits as directed by your caregiver. SEEK MEDICAL CARE IF: You are unsure if you are in labor or if your water has broken. You have dizziness. You have mild pelvic cramps, pelvic pressure, or nagging pain in your abdominal area. You have persistent nausea, vomiting, or diarrhea. You have a bad smelling vaginal discharge. You have pain with urination. SEEK IMMEDIATE MEDICAL CARE IF:  You  have a fever. You are leaking fluid from your vagina. You have spotting or bleeding from your vagina. You have severe abdominal cramping or pain. You have rapid weight loss or gain. You have shortness of breath with chest pain. You notice sudden or extreme swelling of your face, hands, ankles, feet, or legs. You have not felt your baby move in over an hour. You have severe headaches that do not go away with medicine. You have vision changes. Document Released: 04/16/2001 Document Revised: 04/27/2013 Document Reviewed: 06/23/2012 [facility] Patient Information 2015 [place], [place]. This information is not intended to replace advice given to you by your health care provider. Make sure you discuss any questions you have with your health care provider.

## 2023-01-27 NOTE — Progress Notes (Signed)
[place] 29+2 wks,breech,anterior placenta gr 0,BPP 8/8,AFI 20 cm,FHR 140 bpm,cx 4.2 cm,RI .69,.65,.62=53%

## 2023-01-27 NOTE — Progress Notes (Signed)
HIGH-RISK PREGNANCY VISIT Patient name: Norma Aguilar MRN [id number]  Date of birth: 04-Feb-2001 Chief Complaint:   Routine Prenatal Visit (Ultrasound today)  History of Present Illness:   Norma Aguilar is a 22 y.o. G70P0000 female at [redacted]w[redacted]d with an Estimated Date of Delivery: 04/12/23 being seen today for ongoing management of a high-risk pregnancy complicated by gestational hypertension currently on nifedipine 30mg  daily.    Today she reports  Lt groin muscle pain . Denies ha, visual changes, ruq/epigastric pain, n/v.   Contractions: Not present. Vag. Bleeding: None.  Movement: Present. denies leaking of fluid.      10/01/2022    3:25 PM 07/18/2022   10:22 AM 12/07/2015    1:48 PM 01/19/2014    2:01 PM  Depression screen PHQ 2/9  Decreased Interest 0 0    Down, Depressed, Hopeless 0 0    PHQ - 2 Score 0 0    Altered sleeping 3 0    Tired, decreased energy 3 0    Change in appetite 3 0    Feeling bad or failure about yourself  0 0    Trouble concentrating 1 0    Moving slowly or fidgety/restless 0 0    Suicidal thoughts 0 0    PHQ-9 Score 10 0       Information is confidential and restricted. Go to Review Flowsheets to unlock data.        10/01/2022    3:25 PM 07/18/2022   10:22 AM 12/07/2015    1:50 PM  GAD 7 : Generalized Anxiety Score  Nervous, Anxious, on Edge 2 1   Control/stop worrying 1 1   Worry too much - different things 1 0   Trouble relaxing 0 0   Restless 0 0   Easily annoyed or irritable 2 0   Afraid - awful might happen 1 0   Total GAD 7 Score 7 2   Anxiety Difficulty        Information is confidential and restricted. Go to Review Flowsheets to unlock data.     Review of Systems:   Pertinent items are noted in HPI Denies abnormal vaginal discharge w/ itching/odor/irritation, headaches, visual changes, shortness of breath, chest pain, abdominal pain, severe nausea/vomiting, or problems with urination or bowel movements unless otherwise stated  above. Pertinent History Reviewed:  Reviewed past medical,surgical, social, obstetrical and family history.  Reviewed problem list, medications and allergies. Physical Assessment:   Vitals:   01/27/23 1043  BP: 138/84  Pulse: 97  Weight: 192 lb (87.1 kg)  Body mass index is 36.28 kg/m.           Physical Examination:   General appearance: alert, well appearing, and in no distress  Mental status: alert, oriented to person, place, and time  Skin: warm & dry   Extremities: Edema: None    Cardiovascular: normal heart rate noted  Respiratory: normal respiratory effort, no distress  Abdomen: gravid, soft, non-tender  Pelvic: Cervical exam deferred         Fetal Status:     Movement: Present    Fetal Surveillance Testing today: [place] 29+2 wks,breech,anterior placenta gr 0,BPP 8/8,AFI 20 cm,FHR 140 bpm,cx 4.2 cm,RI .69,.65,.62=53%   Chaperone: N/A    Results for orders placed or performed in visit on 01/27/23 (from the past 24 hour(s))  POC Urinalysis Dipstick OB   Collection Time: 01/27/23 10:53 AM  Result Value Ref Range   Color, UA     Clarity, UA  Glucose, UA Negative Negative   Bilirubin, UA     Ketones, UA Negative    Spec Grav, UA     Blood, UA Negative    pH, UA     POC,PROTEIN,UA Negative Negative, Trace, Small (1+), Moderate (2+), Large (3+), 4+   Urobilinogen, UA     Nitrite, UA Negative    Leukocytes, UA Trace (A) Negative   Appearance     Odor      Assessment & Plan:  High-risk pregnancy: G1P0000 at [redacted]w[redacted]d with an Estimated Date of Delivery: 04/12/23   1) GHTN dx @ 25wk, stable on nifedipine 30mg  daily, reviewed pre-e s/s, reasons to seek care  2) Lt groin pain, continue exercise, slow position changes, etc  Meds: No orders of the defined types were placed in this encounter.   Labs/procedures today: U/S  Treatment Plan:  [place] q 4wks     Weekly BPPs add NST @ 32wk           Deliver @ 37-37.6wks:___   Reviewed: Preterm labor symptoms and general obstetric  precautions including but not limited to vaginal bleeding, contractions, leaking of fluid and fetal movement were reviewed in detail with the patient.  All questions were answered. Does have home bp cuff. Office bp cuff given: not applicable. Check bp daily, let us know if consistently >140 and/or >90.  Follow-up: Return for As scheduled.   Future Appointments  Date Time Provider Department Center  02/04/2023  3:00 PM [facility] [place] [facility] None  02/04/2023  3:50 PM Lazaro Arms, MD [facility]  02/14/2023 11:30 AM [facility] NURSE [facility]    Orders Placed This Encounter  Procedures   POC Urinalysis Dipstick OB   Cheral Marker CNM, [facility] 01/27/2023 11:19 AM

## 2023-02-03 ENCOUNTER — Encounter: Payer: 59 | Admitting: Women's Health

## 2023-02-03 ENCOUNTER — Other Ambulatory Visit: Payer: 59

## 2023-02-04 ENCOUNTER — Ambulatory Visit (INDEPENDENT_AMBULATORY_CARE_PROVIDER_SITE_OTHER): Payer: 59 | Admitting: Obstetrics & Gynecology

## 2023-02-04 ENCOUNTER — Ambulatory Visit (INDEPENDENT_AMBULATORY_CARE_PROVIDER_SITE_OTHER): Payer: 59

## 2023-02-04 ENCOUNTER — Encounter: Payer: Self-pay | Admitting: Obstetrics & Gynecology

## 2023-02-04 VITALS — BP 140/83 | HR 104 | Wt 199.0 lb

## 2023-02-04 DIAGNOSIS — O0993 Supervision of high risk pregnancy, unspecified, third trimester: Secondary | ICD-10-CM | POA: Diagnosis not present

## 2023-02-04 DIAGNOSIS — O133 Gestational [pregnancy-induced] hypertension without significant proteinuria, third trimester: Secondary | ICD-10-CM

## 2023-02-04 DIAGNOSIS — Z3A30 30 weeks gestation of pregnancy: Secondary | ICD-10-CM

## 2023-02-04 DIAGNOSIS — O0990 Supervision of high risk pregnancy, unspecified, unspecified trimester: Secondary | ICD-10-CM

## 2023-02-04 DIAGNOSIS — O0992 Supervision of high risk pregnancy, unspecified, second trimester: Secondary | ICD-10-CM

## 2023-02-04 DIAGNOSIS — O132 Gestational [pregnancy-induced] hypertension without significant proteinuria, second trimester: Secondary | ICD-10-CM

## 2023-02-04 LAB — POCT URINALYSIS DIPSTICK OB
Blood, UA: NEGATIVE
Glucose, UA: NEGATIVE
Ketones, UA: NEGATIVE
Leukocytes, UA: NEGATIVE
Nitrite, UA: NEGATIVE
POC,PROTEIN,UA: NEGATIVE

## 2023-02-04 NOTE — Progress Notes (Signed)
[place] 30+3 wks,cephalic,anterior placenta gr 0,BPP 8/8,FHR 150 bpm,AFI 15 cm,RI .63,.71=69%

## 2023-02-04 NOTE — Progress Notes (Signed)
HIGH-RISK PREGNANCY VISIT Patient name: Norma Aguilar MRN [id number]  Date of birth: February 14, 2001 Chief Complaint:   Routine Prenatal Visit  History of Present Illness:   Norma Aguilar is a 22 y.o. G89P0000 female at [redacted]w[redacted]d with an Estimated Date of Delivery: 04/12/23 being seen today for ongoing management of a high-risk pregnancy complicated by gHTN.    Today she reports no complaints. Contractions: Not present. Vag. Bleeding: None.  Movement: Present. denies leaking of fluid.      10/01/2022    3:25 PM 07/18/2022   10:22 AM 12/07/2015    1:48 PM 01/19/2014    2:01 PM  Depression screen PHQ 2/9  Decreased Interest 0 0    Down, Depressed, Hopeless 0 0    PHQ - 2 Score 0 0    Altered sleeping 3 0    Tired, decreased energy 3 0    Change in appetite 3 0    Feeling bad or failure about yourself  0 0    Trouble concentrating 1 0    Moving slowly or fidgety/restless 0 0    Suicidal thoughts 0 0    PHQ-9 Score 10 0       Information is confidential and restricted. Go to Review Flowsheets to unlock data.        10/01/2022    3:25 PM 07/18/2022   10:22 AM 12/07/2015    1:50 PM  GAD 7 : Generalized Anxiety Score  Nervous, Anxious, on Edge 2 1   Control/stop worrying 1 1   Worry too much - different things 1 0   Trouble relaxing 0 0   Restless 0 0   Easily annoyed or irritable 2 0   Afraid - awful might happen 1 0   Total GAD 7 Score 7 2   Anxiety Difficulty        Information is confidential and restricted. Go to Review Flowsheets to unlock data.     Review of Systems:   Pertinent items are noted in HPI Denies abnormal vaginal discharge w/ itching/odor/irritation, headaches, visual changes, shortness of breath, chest pain, abdominal pain, severe nausea/vomiting, or problems with urination or bowel movements unless otherwise stated above. Pertinent History Reviewed:  Reviewed past medical,surgical, social, obstetrical and family history.  Reviewed problem list, medications and  allergies. Physical Assessment:   Vitals:   02/04/23 1535  BP: (!) 142/86  Pulse: (!) 111  Weight: 199 lb (90.3 kg)  Body mass index is 37.6 kg/m.           Physical Examination:   General appearance: alert, well appearing, and in no distress  Mental status: alert, oriented to person, place, and time  Skin: warm & dry   Extremities:      Cardiovascular: normal heart rate noted  Respiratory: normal respiratory effort, no distress  Abdomen: gravid, soft, non-tender  Pelvic: Cervical exam deferred         Fetal Status:     Movement: Present    Fetal Surveillance Testing today: BPP 8/8 UAD  69%  Chaperone: N/A    No results found for this or any previous visit (from the past 24 hour(s)).  Assessment & Plan:  High-risk pregnancy: G1P0000 at [redacted]w[redacted]d with an Estimated Date of Delivery: 04/12/23      ICD-10-CM   1. Supervision of high risk pregnancy, antepartum  O09.90     2. Gestational hypertension, third trimester  O13.3         Meds: No orders of the  defined types were placed in this encounter.   Orders: No orders of the defined types were placed in this encounter.    Labs/procedures today: U/S  Treatment Plan:  weekly BPP til 32 then alt NST  Reviewed: Preterm labor symptoms and general obstetric precautions including but not limited to vaginal bleeding, contractions, leaking of fluid and fetal movement were reviewed in detail with the patient.  All questions were answered. Does have home bp cuff. Office bp cuff given: yes. Check bp twice daily, let us know if consistently >150 and/or >95.  Follow-up: No follow-ups on file.   Future Appointments  Date Time Provider Department Center  02/14/2023 11:30 AM [facility] NURSE [facility]    No orders of the defined types were placed in this encounter.  Lazaro Arms  Attending Physician for the Center for [facility] 02/04/2023 4:26 PM

## 2023-02-04 NOTE — Addendum Note (Signed)
Addended by: Annamarie Dawley on: 02/04/2023 04:33 PM   Modules accepted: Orders

## 2023-02-13 ENCOUNTER — Other Ambulatory Visit (INDEPENDENT_AMBULATORY_CARE_PROVIDER_SITE_OTHER): Payer: 59

## 2023-02-13 ENCOUNTER — Ambulatory Visit (INDEPENDENT_AMBULATORY_CARE_PROVIDER_SITE_OTHER): Payer: 59 | Admitting: Obstetrics & Gynecology

## 2023-02-13 VITALS — BP 126/80 | HR 96 | Wt 198.2 lb

## 2023-02-13 DIAGNOSIS — O0990 Supervision of high risk pregnancy, unspecified, unspecified trimester: Secondary | ICD-10-CM

## 2023-02-13 DIAGNOSIS — Z3A31 31 weeks gestation of pregnancy: Secondary | ICD-10-CM | POA: Diagnosis not present

## 2023-02-13 DIAGNOSIS — O0993 Supervision of high risk pregnancy, unspecified, third trimester: Secondary | ICD-10-CM

## 2023-02-13 DIAGNOSIS — O133 Gestational [pregnancy-induced] hypertension without significant proteinuria, third trimester: Secondary | ICD-10-CM | POA: Diagnosis not present

## 2023-02-13 DIAGNOSIS — O0992 Supervision of high risk pregnancy, unspecified, second trimester: Secondary | ICD-10-CM

## 2023-02-13 DIAGNOSIS — O132 Gestational [pregnancy-induced] hypertension without significant proteinuria, second trimester: Secondary | ICD-10-CM

## 2023-02-13 NOTE — Progress Notes (Signed)
[place] 31+5 wks,cephalic,anterior placenta gr 1,FHR 138 bpm,BPP 8/8,AFI 21 cm,RI .65,.56,.62,.65=49%

## 2023-02-13 NOTE — Progress Notes (Signed)
HIGH-RISK PREGNANCY VISIT Patient name: Norma Aguilar MRN [id number]  Date of birth: Mar 01, 2001 Chief Complaint:   Routine Prenatal Visit  History of Present Illness:   Norma Aguilar is a 22 y.o. G24P0000 female at [redacted]w[redacted]d with an Estimated Date of Delivery: 04/12/23 being seen today for ongoing management of a high-risk pregnancy complicated by:  -Gest HTN on Procardia XL 30mg  daily BP stable, asymptomatic  Today she reports no complaints.   Contractions: Not present. Vag. Bleeding: None.  Movement: Present. denies leaking of fluid.      10/01/2022    3:25 PM 07/18/2022   10:22 AM 12/07/2015    1:48 PM 01/19/2014    2:01 PM  Depression screen PHQ 2/9  Decreased Interest 0 0    Down, Depressed, Hopeless 0 0    PHQ - 2 Score 0 0    Altered sleeping 3 0    Tired, decreased energy 3 0    Change in appetite 3 0    Feeling bad or failure about yourself  0 0    Trouble concentrating 1 0    Moving slowly or fidgety/restless 0 0    Suicidal thoughts 0 0    PHQ-9 Score 10 0       Information is confidential and restricted. Go to Review Flowsheets to unlock data.     Current Outpatient Medications  Medication Instructions   butalbital-acetaminophen-caffeine (FIORICET) 50-325-40 MG tablet 1-2 tablets, Oral, Every 6 hours PRN   NIFEdipine (PROCARDIA-XL/NIFEDICAL-XL) 30 mg, Oral, Daily, Can increase to twice a day as needed for symptomatic contractions   ondansetron (ZOFRAN) 4 mg, Oral, Every 8 hours PRN   Prenat w/o A-FeCbGl-DSS-FA-DHA (CITRANATAL 90 DHA) 90-1 & 300 MG MISC Take as directed     Review of Systems:   Pertinent items are noted in HPI Denies abnormal vaginal discharge w/ itching/odor/irritation, headaches, visual changes, shortness of breath, chest pain, abdominal pain, severe nausea/vomiting, or problems with urination or bowel movements unless otherwise stated above. Pertinent History Reviewed:  Reviewed past medical,surgical, social, obstetrical and family history.   Reviewed problem list, medications and allergies. Physical Assessment:   Vitals:   02/13/23 0959  BP: 126/80  Pulse: 96  Weight: 198 lb 3.2 oz (89.9 kg)  Body mass index is 37.45 kg/m.           Physical Examination:   General appearance: alert, well appearing, and in no distress  Mental status: normal mood, behavior, speech, dress, motor activity, and thought processes  Skin: warm & dry   Extremities:   1+ edema   Cardiovascular: normal heart rate noted  Respiratory: normal respiratory effort, no distress  Abdomen: gravid, soft, non-tender  Pelvic: Cervical exam deferred         Fetal Status:     Movement: Present    Fetal Surveillance Testing today: cephalic,anterior placenta gr 1,FHR 138 bpm,BPP 8/8,AFI 21 cm,RI .65,.56,.62,.65=49%    Chaperone: N/A    No results found for this or any previous visit (from the past 24 hour(s)).   Assessment & Plan:  High-risk pregnancy: G1P0000 at [redacted]w[redacted]d with an Estimated Date of Delivery: 04/12/23   1) Gest HTN -continue with current meds (procardia 30mg  daily) -BPP 8/8, normal dopplers -continue antepartum testing and serial growth scans -repeat lab work today []  plan for IOL 37-38wk   Meds: No orders of the defined types were placed in this encounter.   Labs/procedures today: CBC, CMP, PC ratio  Treatment Plan:  as outlined above  Reviewed:  Preterm labor symptoms and general obstetric precautions including but not limited to vaginal bleeding, contractions, leaking of fluid and fetal movement were reviewed in detail with the patient.  All questions were answered. Pt has home bp cuff. Check bp weekly, let us know if >140/90.   Follow-up: Return for Twice weekly NST/BPP/HROB as scheduled.   Future Appointments  Date Time Provider Department Center  02/18/2023 10:30 AM [facility] - FT IMG 2 [facility]FTIMG None  02/18/2023 11:50 AM Lazaro Arms, MD [facility]-FT FTOBGYN  02/21/2023 10:50 AM [facility]FTOBGYN NURSE [facility]  02/24/2023 11:30 AM  [facility] - FTOBGYN [place] [facility]FTIMG None  02/24/2023  1:30 PM Lazaro Arms, MD [facility]FT FTOBGYN  02/27/2023  2:50 PM [facility]FTOBGYN NURSE [facility]  03/03/2023  1:30 PM [facility] - FTOBGYN [place] [facility]FTIMG None  03/03/2023  2:50 PM Myna Hidalgo, DO [facility]FT FTOBGYN  03/06/2023  1:50 PM [facility]FTOBGYN NURSE [facility] FTO[facility]YN  03/10/2023  1:30 PM [facility] - FT IMG 2 [facility]FTIMG None  03/10/2023  2:30 PM Lazaro Arms, MD [facility]FT FTO[facility]YN  03/13/2023  2:30 PM [facility]FTOBGYN NURSE [facility]FT FTO[facility]YN  03/17/2023  2:15 PM [facility] - FT IMG 2 [facility]FTIMG None  03/17/2023  3:10 PM Lazaro Arms, MD [facility]FT FTOBGYN  03/20/2023  2:30 PM [facility]FTOBGYN NURSE [facility]-FT FTOBGYN    Orders Placed This Encounter  Procedures   CBC   Comprehensive metabolic panel   Protein / creatinine ratio, urine    Myna Hidalgo, DO Attending Obstetrician & Gynecologist, [facility] Practice Center for [employer], [facility]

## 2023-02-14 ENCOUNTER — Other Ambulatory Visit: Payer: 59

## 2023-02-14 LAB — PROTEIN / CREATININE RATIO, URINE
Creatinine, Urine: 48.9 mg/dL
Protein, Ur: 6.6 mg/dL
Protein/Creat Ratio: 135 mg/g{creat} (ref 0–200)

## 2023-02-18 ENCOUNTER — Encounter: Payer: Self-pay | Admitting: Obstetrics & Gynecology

## 2023-02-18 ENCOUNTER — Other Ambulatory Visit: Payer: 59 | Admitting: Radiology

## 2023-02-18 ENCOUNTER — Ambulatory Visit (INDEPENDENT_AMBULATORY_CARE_PROVIDER_SITE_OTHER): Payer: 59 | Admitting: Obstetrics & Gynecology

## 2023-02-18 VITALS — BP 131/84 | HR 103 | Wt 201.0 lb

## 2023-02-18 DIAGNOSIS — O0993 Supervision of high risk pregnancy, unspecified, third trimester: Secondary | ICD-10-CM

## 2023-02-18 DIAGNOSIS — O132 Gestational [pregnancy-induced] hypertension without significant proteinuria, second trimester: Secondary | ICD-10-CM

## 2023-02-18 DIAGNOSIS — O133 Gestational [pregnancy-induced] hypertension without significant proteinuria, third trimester: Secondary | ICD-10-CM

## 2023-02-18 DIAGNOSIS — O0992 Supervision of high risk pregnancy, unspecified, second trimester: Secondary | ICD-10-CM

## 2023-02-18 DIAGNOSIS — Z3A32 32 weeks gestation of pregnancy: Secondary | ICD-10-CM

## 2023-02-18 DIAGNOSIS — O0990 Supervision of high risk pregnancy, unspecified, unspecified trimester: Secondary | ICD-10-CM

## 2023-02-18 NOTE — Progress Notes (Signed)
[place] 32+3 wks by EDD Single active female fetus,  [place],   FHR = 132 bpm EFW 32 %   BPP = 8/8  AFI 19.5 cm  80%  SVP 6.3 cm UAD RI = 0.66    74% CL = 4.4 cm closed -  normal ovaries - neg adnexal regions  neg CDS, no free fluid present

## 2023-02-18 NOTE — Progress Notes (Signed)
HIGH-RISK PREGNANCY VISIT Patient name: Norma Aguilar MRN [id number]  Date of birth: 2000-12-29 Chief Complaint:   Routine Prenatal Visit  History of Present Illness:   Norma Aguilar is a 22 y.o. G57P0000 female at [redacted]w[redacted]d with an Estimated Date of Delivery: 04/12/23 being seen today for ongoing management of a high-risk pregnancy complicated by gHTN on Nifedipine XL 30.    Today she reports no complaints. Contractions: Not present. Vag. Bleeding: None.  Movement: Present. denies leaking of fluid.      10/01/2022    3:25 PM 07/18/2022   10:22 AM 12/07/2015    1:48 PM 01/19/2014    2:01 PM  Depression screen PHQ 2/9  Decreased Interest 0 0    Down, Depressed, Hopeless 0 0    PHQ - 2 Score 0 0    Altered sleeping 3 0    Tired, decreased energy 3 0    Change in appetite 3 0    Feeling bad or failure about yourself  0 0    Trouble concentrating 1 0    Moving slowly or fidgety/restless 0 0    Suicidal thoughts 0 0    PHQ-9 Score 10 0       Information is confidential and restricted. Go to Review Flowsheets to unlock data.        10/01/2022    3:25 PM 07/18/2022   10:22 AM 12/07/2015    1:50 PM  GAD 7 : Generalized Anxiety Score  Nervous, Anxious, on Edge 2 1   Control/stop worrying 1 1   Worry too much - different things 1 0   Trouble relaxing 0 0   Restless 0 0   Easily annoyed or irritable 2 0   Afraid - awful might happen 1 0   Total GAD 7 Score 7 2   Anxiety Difficulty        Information is confidential and restricted. Go to Review Flowsheets to unlock data.     Review of Systems:   Pertinent items are noted in HPI Denies abnormal vaginal discharge w/ itching/odor/irritation, headaches, visual changes, shortness of breath, chest pain, abdominal pain, severe nausea/vomiting, or problems with urination or bowel movements unless otherwise stated above. Pertinent History Reviewed:  Reviewed past medical,surgical, social, obstetrical and family history.  Reviewed problem  list, medications and allergies. Physical Assessment:   Vitals:   02/18/23 1114  BP: 131/84  Pulse: (!) 103  Weight: 201 lb (91.2 kg)  Body mass index is 37.98 kg/m.           Physical Examination:   General appearance: alert, well appearing, and in no distress  Mental status: alert, oriented to person, place, and time  Skin: warm & dry   Extremities:      Cardiovascular: normal heart rate noted  Respiratory: normal respiratory effort, no distress  Abdomen: gravid, soft, non-tender  Pelvic: Cervical exam deferred         Fetal Status:     Movement: Present    Fetal Surveillance Testing today: BPP 8/8 with UAD    Chaperone: N/A    No results found for this or any previous visit (from the past 24 hour(s)).  Assessment & Plan:  High-risk pregnancy: G1P0000 at [redacted]w[redacted]d with an Estimated Date of Delivery: 04/12/23      ICD-10-CM   1. Supervision of high risk pregnancy, antepartum  O09.90     2. Gestational hypertension: Procardia xl 30  O13.3        Meds:  No orders of the defined types were placed in this encounter.   Orders: No orders of the defined types were placed in this encounter.    Labs/procedures today: U/S  Treatment Plan:  twice weekly surveillance   Reviewed: Preterm labor symptoms and general obstetric precautions including but not limited to vaginal bleeding, contractions, leaking of fluid and fetal movement were reviewed in detail with the patient.  All questions were answered. Does have home bp cuff. Office bp cuff given: yes. Check bp twice daily, let us know if consistently >150 and/or >95.  Follow-up: Return for keep scheduled.   Future Appointments  Date Time Provider Department Center  02/18/2023 11:50 AM Lazaro Arms, MD [facility]FT FTOBGYN  02/21/2023 10:50 AM [facility]FTOBGYN NURSE [facility]TO[facility]  02/24/2023 11:30 AM [facility] - FTOBGYN [place] [facility]-FTIMG None  02/24/2023  1:30 PM Lazaro Arms, MD [facility]FT FTOBGYN  02/27/2023  2:50 PM [facility]-FTOBGYN NURSE [facility]-FT  FTOBGYN  03/03/2023  1:30 PM [facility] - FTOBGYN [place] [facility]FTIMG None  03/03/2023  2:50 PM Myna Hidalgo, DO [facility]FT FTOBGYN  03/06/2023  1:50 PM [facility]FTOBGYN NURSE [facility] FTOBGYN  03/10/2023  1:30 PM [facility] - FT IMG 2 [facility]FTIMG None  03/10/2023  2:30 PM Lazaro Arms, MD [facility] FTO[facility]YN  03/13/2023  2:30 PM [facility]FTOBGYN NURSE [facility] FTO[facility]YN  03/17/2023  2:15 PM [facility] - FT IMG 2 [facility]FTIMG None  03/17/2023  3:10 PM Lazaro Arms, MD [facility]FT FTOBGYN  03/20/2023  2:30 PM [facility]FTOBGYN NURSE [facility] FTO[facility]YN    No orders of the defined types were placed in this encounter.  Lazaro Arms  Attending Physician for the Center for [facility] 02/18/2023 11:25 AM

## 2023-02-21 ENCOUNTER — Other Ambulatory Visit: Payer: Self-pay

## 2023-02-21 ENCOUNTER — Ambulatory Visit (INDEPENDENT_AMBULATORY_CARE_PROVIDER_SITE_OTHER): Payer: 59

## 2023-02-21 VITALS — BP 129/75 | HR 96 | Wt 202.4 lb

## 2023-02-21 DIAGNOSIS — O0990 Supervision of high risk pregnancy, unspecified, unspecified trimester: Secondary | ICD-10-CM

## 2023-02-21 DIAGNOSIS — O0993 Supervision of high risk pregnancy, unspecified, third trimester: Secondary | ICD-10-CM

## 2023-02-21 DIAGNOSIS — O133 Gestational [pregnancy-induced] hypertension without significant proteinuria, third trimester: Secondary | ICD-10-CM

## 2023-02-21 DIAGNOSIS — Z3A32 32 weeks gestation of pregnancy: Secondary | ICD-10-CM | POA: Diagnosis not present

## 2023-02-21 LAB — POCT URINALYSIS DIPSTICK OB
Blood, UA: NEGATIVE
Glucose, UA: NEGATIVE
Ketones, UA: NEGATIVE
Leukocytes, UA: NEGATIVE
Nitrite, UA: NEGATIVE
POC,PROTEIN,UA: NEGATIVE

## 2023-02-21 MED ORDER — BUTALBITAL-APAP-CAFFEINE 50-325-40 MG PO TABS: 1.0000 | ORAL_TABLET | Freq: Four times a day (QID) | ORAL | 0 refills | Status: DC | PRN

## 2023-02-21 NOTE — Progress Notes (Signed)
   NURSE VISIT- NST  SUBJECTIVE:  [place] Harvill is a 22 y.o. G1P0000 female at [redacted]w[redacted]d, here for a NST for pregnancy complicated by [facility].  She reports active fetal movement, contractions: irregular, every 10 minutes, vaginal bleeding: none, membranes: intact.   OBJECTIVE:  BP 129/75   Pulse 96   Wt 202 lb 6.4 oz (91.8 kg)   LMP 06/24/2022 (Approximate)   BMI 38.24 kg/m   Appears well, no apparent distress  Results for orders placed or performed in visit on 02/21/23 (from the past 24 hour(s))  POC Urinalysis Dipstick OB   Collection Time: 02/21/23 11:17 AM  Result Value Ref Range   Color, UA     Clarity, UA     Glucose, UA Negative Negative   Bilirubin, UA     Ketones, UA Negative    Spec Grav, UA     Blood, UA Negative    pH, UA     POC,PROTEIN,UA Negative Negative, Trace, Small (1+), Moderate (2+), Large (3+), 4+   Urobilinogen, UA     Nitrite, UA Negative    Leukocytes, UA Negative Negative   Appearance     Odor      NST: FHR baseline 130 bpm, Variability: moderate, Accelerations:present, Decelerations:  Absent= Cat 1/reactive Toco: Irregular every 10 minutes   ASSESSMENT: G1P0000 at [redacted]w[redacted]d with GHTN NST reactive  PLAN: EFM strip reviewed by Dr. Despina Hidden   Recommendations: keep next appointment as scheduled Patient requests refill on Fioricet Preterm labor precautions reviewed, patient verbalized understanding Reviewed preeclampsia symptoms, patient verbalized understanding    Caralyn Guile  02/21/2023 11:38 AM

## 2023-02-24 ENCOUNTER — Ambulatory Visit (INDEPENDENT_AMBULATORY_CARE_PROVIDER_SITE_OTHER): Payer: 59 | Admitting: Obstetrics & Gynecology

## 2023-02-24 ENCOUNTER — Ambulatory Visit (INDEPENDENT_AMBULATORY_CARE_PROVIDER_SITE_OTHER): Payer: 59

## 2023-02-24 ENCOUNTER — Encounter: Payer: Self-pay | Admitting: Obstetrics & Gynecology

## 2023-02-24 VITALS — BP 144/90 | HR 112 | Wt 203.5 lb

## 2023-02-24 DIAGNOSIS — O0993 Supervision of high risk pregnancy, unspecified, third trimester: Secondary | ICD-10-CM

## 2023-02-24 DIAGNOSIS — O0992 Supervision of high risk pregnancy, unspecified, second trimester: Secondary | ICD-10-CM

## 2023-02-24 DIAGNOSIS — O0990 Supervision of high risk pregnancy, unspecified, unspecified trimester: Secondary | ICD-10-CM

## 2023-02-24 DIAGNOSIS — Z3A33 33 weeks gestation of pregnancy: Secondary | ICD-10-CM | POA: Diagnosis not present

## 2023-02-24 DIAGNOSIS — O133 Gestational [pregnancy-induced] hypertension without significant proteinuria, third trimester: Secondary | ICD-10-CM

## 2023-02-24 DIAGNOSIS — O132 Gestational [pregnancy-induced] hypertension without significant proteinuria, second trimester: Secondary | ICD-10-CM

## 2023-02-24 LAB — POCT URINALYSIS DIPSTICK OB
Blood, UA: NEGATIVE
Glucose, UA: NEGATIVE
Ketones, UA: NEGATIVE
Leukocytes, UA: NEGATIVE
Nitrite, UA: NEGATIVE
POC,PROTEIN,UA: NEGATIVE

## 2023-02-24 NOTE — Progress Notes (Signed)
HIGH-RISK PREGNANCY VISIT Patient name: Norma Aguilar MRN [id number]  Date of birth: Oct 03, 2000 Chief Complaint:   Routine Prenatal Visit (Ultrasound today)  History of Present Illness:   Norma Aguilar is a 22 y.o. G20P0000 female at [redacted]w[redacted]d with an Estimated Date of Delivery: 04/12/23 being seen today for ongoing management of a high-risk pregnancy complicated by gHTN on Procardia XL 30 qd.    Today she reports no complaints. Contractions: Not present. Vag. Bleeding: None.  Movement: Present. denies leaking of fluid.      10/01/2022    3:25 PM 07/18/2022   10:22 AM 12/07/2015    1:48 PM 01/19/2014    2:01 PM  Depression screen PHQ 2/9  Decreased Interest 0 0    Down, Depressed, Hopeless 0 0    PHQ - 2 Score 0 0    Altered sleeping 3 0    Tired, decreased energy 3 0    Change in appetite 3 0    Feeling bad or failure about yourself  0 0    Trouble concentrating 1 0    Moving slowly or fidgety/restless 0 0    Suicidal thoughts 0 0    PHQ-9 Score 10 0       Information is confidential and restricted. Go to Review Flowsheets to unlock data.        10/01/2022    3:25 PM 07/18/2022   10:22 AM 12/07/2015    1:50 PM  GAD 7 : Generalized Anxiety Score  Nervous, Anxious, on Edge 2 1   Control/stop worrying 1 1   Worry too much - different things 1 0   Trouble relaxing 0 0   Restless 0 0   Easily annoyed or irritable 2 0   Afraid - awful might happen 1 0   Total GAD 7 Score 7 2   Anxiety Difficulty        Information is confidential and restricted. Go to Review Flowsheets to unlock data.     Review of Systems:   Pertinent items are noted in HPI Denies abnormal vaginal discharge w/ itching/odor/irritation, headaches, visual changes, shortness of breath, chest pain, abdominal pain, severe nausea/vomiting, or problems with urination or bowel movements unless otherwise stated above. Pertinent History Reviewed:  Reviewed past medical,surgical, social, obstetrical and family history.   Reviewed problem list, medications and allergies. Physical Assessment:   Vitals:   02/24/23 1225  BP: (!) 144/90  Pulse: (!) 112  Weight: 203 lb 8 oz (92.3 kg)  Body mass index is 38.45 kg/m.           Physical Examination:   General appearance: alert, well appearing, and in no distress  Mental status: alert, oriented to person, place, and time  Skin: warm & dry   Extremities: Edema: Trace    Cardiovascular: normal heart rate noted  Respiratory: normal respiratory effort, no distress  Abdomen: gravid, soft, non-tender  Pelvic: Cervical exam deferred         Fetal Status:     Movement: Present    Fetal Surveillance Testing today: BPP 8/8 UAD 78%   Chaperone: N/A    No results found for this or any previous visit (from the past 24 hour(s)).  Assessment & Plan:  High-risk pregnancy: G1P0000 at [redacted]w[redacted]d with an Estimated Date of Delivery: 04/12/23      ICD-10-CM   1. Supervision of high risk pregnancy, antepartum  O09.90     2. Gestational hypertension: Dx 25w on Nifedipine XL 30 qd  O13.3         Meds: No orders of the defined types were placed in this encounter.   Orders: No orders of the defined types were placed in this encounter.    Labs/procedures today: U/S  Treatment Plan:  twice weekly surveillance  Reviewed: Preterm labor symptoms and general obstetric precautions including but not limited to vaginal bleeding, contractions, leaking of fluid and fetal movement were reviewed in detail with the patient.  All questions were answered. Does have home bp cuff. Office bp cuff given: not applicable. Check bp twice daily, let us know if consistently >150 and/or >95.  Follow-up: No follow-ups on file.   Future Appointments  Date Time Provider Department Center  02/24/2023  1:30 PM Lazaro Arms, MD [facility]FT FTO[facility]YN  02/27/2023  2:50 PM [facility]FTOBGYN NURSE [facility]  03/03/2023  1:30 PM [facility] - FTOBGYN [place] [facility]FTIMG None  03/03/2023  2:50 PM Myna Hidalgo, DO  [facility]FT FTOBGYN  03/06/2023  1:50 PM [facility]-FTOBGYN NURSE [facility] FTO[facility]  03/10/2023  1:30 PM [facility] - FT IMG 2 [facility]FTIMG None  03/10/2023  2:50 PM Lazaro Arms, MD [facility] FTO[facility]YN  03/13/2023  2:30 PM [facility]FTOBGYN NURSE [facility] FTO[facility]  03/17/2023  2:15 PM [facility] - FT IMG 2 [facility]-FTIMG None  03/17/2023  3:10 PM Lazaro Arms, MD [facility] FTO[facility]YN  03/20/2023  2:30 PM [facility]FTOBGYN NURSE [facility] FTO[facility]    No orders of the defined types were placed in this encounter.  Lazaro Arms  Attending Physician for the Center for [facility] 02/24/2023 12:29 PM

## 2023-02-24 NOTE — Progress Notes (Signed)
[place] 33+2 wks,frank breech,anterior placenta gr 1,FHR 148 bpm,AFI 17.5 cm,BPP 8/8,RI .66,.67,.66=78%

## 2023-02-27 ENCOUNTER — Ambulatory Visit: Payer: 59 | Admitting: *Deleted

## 2023-02-27 VITALS — BP 125/78 | HR 99

## 2023-02-27 DIAGNOSIS — O133 Gestational [pregnancy-induced] hypertension without significant proteinuria, third trimester: Secondary | ICD-10-CM | POA: Diagnosis not present

## 2023-02-27 DIAGNOSIS — Z3A33 33 weeks gestation of pregnancy: Secondary | ICD-10-CM | POA: Diagnosis not present

## 2023-02-27 DIAGNOSIS — O0993 Supervision of high risk pregnancy, unspecified, third trimester: Secondary | ICD-10-CM | POA: Diagnosis not present

## 2023-02-27 DIAGNOSIS — O0990 Supervision of high risk pregnancy, unspecified, unspecified trimester: Secondary | ICD-10-CM

## 2023-02-27 NOTE — Progress Notes (Signed)
   NURSE VISIT- NST  SUBJECTIVE:  Norma Aguilar is a 22 y.o. G1P0000 female at [redacted]w[redacted]d, here for a NST for pregnancy complicated by [facility].  She reports active fetal movement, contractions: none, vaginal bleeding: none, membranes: intact.   OBJECTIVE:  BP 125/78   Pulse 99   LMP 06/24/2022 (Approximate)   Appears well, no apparent distress  No results found for this or any previous visit (from the past 24 hour(s)).  NST: FHR baseline 130 bpm, Variability: moderate, Accelerations:present, Decelerations:  Absent= Cat 1/reactive Toco: none   ASSESSMENT: G1P0000 at [redacted]w[redacted]d with GHTN NST reactive  PLAN: EFM strip reviewed by Dr. Charlotta Newton   Recommendations: keep next appointment as scheduled    Jobe Marker  02/27/2023 3:16 PM

## 2023-03-03 ENCOUNTER — Encounter: Payer: Self-pay | Admitting: Obstetrics & Gynecology

## 2023-03-03 ENCOUNTER — Other Ambulatory Visit: Payer: 59 | Admitting: Radiology

## 2023-03-03 ENCOUNTER — Ambulatory Visit (INDEPENDENT_AMBULATORY_CARE_PROVIDER_SITE_OTHER): Payer: 59 | Admitting: Radiology

## 2023-03-03 ENCOUNTER — Ambulatory Visit (INDEPENDENT_AMBULATORY_CARE_PROVIDER_SITE_OTHER): Payer: 59 | Admitting: Obstetrics & Gynecology

## 2023-03-03 VITALS — BP 136/86 | HR 110 | Wt 207.4 lb

## 2023-03-03 DIAGNOSIS — O0993 Supervision of high risk pregnancy, unspecified, third trimester: Secondary | ICD-10-CM

## 2023-03-03 DIAGNOSIS — O133 Gestational [pregnancy-induced] hypertension without significant proteinuria, third trimester: Secondary | ICD-10-CM

## 2023-03-03 DIAGNOSIS — O0990 Supervision of high risk pregnancy, unspecified, unspecified trimester: Secondary | ICD-10-CM

## 2023-03-03 DIAGNOSIS — Z3A34 34 weeks gestation of pregnancy: Secondary | ICD-10-CM

## 2023-03-03 DIAGNOSIS — O0992 Supervision of high risk pregnancy, unspecified, second trimester: Secondary | ICD-10-CM

## 2023-03-03 DIAGNOSIS — O132 Gestational [pregnancy-induced] hypertension without significant proteinuria, second trimester: Secondary | ICD-10-CM

## 2023-03-03 LAB — POCT URINALYSIS DIPSTICK OB
Blood, UA: NEGATIVE
Glucose, UA: NEGATIVE
Leukocytes, UA: NEGATIVE
Nitrite, UA: NEGATIVE
POC,PROTEIN,UA: NEGATIVE

## 2023-03-03 NOTE — Progress Notes (Signed)
[place] 34+2,  single active female fetus,  cephalic  FHR = 131bpm Anterior placenta  Gr 1    AFI = 15.6 cm  59%   SVP = 5.4 cm EFW 2350g  39%   AC 62% BPP = 8/8     UAD: RI 0.55   ~ 50%

## 2023-03-03 NOTE — Progress Notes (Signed)
HIGH-RISK PREGNANCY VISIT Patient name: [place] Silfies MRN [id number]  Date of birth: January 29, 2001 Chief Complaint:   Routine Prenatal Visit (Ultrasound today)  History of Present Illness:   [place] Buehner is a 22 y.o. G87P0000 female at [redacted]w[redacted]d with an Estimated Date of Delivery: 04/12/23 being seen today for ongoing management of a high-risk pregnancy complicated by:  -Gestational HTN Asymptomatic, doing well with current meds  Today she reports occasional contractions.   Contractions: Not present. Vag. Bleeding: None.  Movement: Present. denies leaking of fluid.      10/01/2022    3:25 PM 07/18/2022   10:22 AM 12/07/2015    1:48 PM 01/19/2014    2:01 PM  Depression screen PHQ 2/9  Decreased Interest 0 0    Down, Depressed, Hopeless 0 0    PHQ - 2 Score 0 0    Altered sleeping 3 0    Tired, decreased energy 3 0    Change in appetite 3 0    Feeling bad or failure about yourself  0 0    Trouble concentrating 1 0    Moving slowly or fidgety/restless 0 0    Suicidal thoughts 0 0    PHQ-9 Score 10 0       Information is confidential and restricted. Go to Review Flowsheets to unlock data.     Current Outpatient Medications  Medication Instructions   butalbital-acetaminophen-caffeine (FIORICET) 50-325-40 MG tablet 1-2 tablets, Oral, Every 6 hours PRN   NIFEdipine (PROCARDIA-XL/NIFEDICAL-XL) 30 mg, Oral, Daily, Can increase to twice a day as needed for symptomatic contractions   ondansetron (ZOFRAN) 4 mg, Oral, Every 8 hours PRN   Prenat w/o A-FeCbGl-DSS-FA-DHA (CITRANATAL 90 DHA) 90-1 & 300 MG MISC Take as directed     Review of Systems:   Pertinent items are noted in HPI Denies abnormal vaginal discharge w/ itching/odor/irritation, headaches, visual changes, shortness of breath, chest pain, abdominal pain, severe nausea/vomiting, or problems with urination or bowel movements unless otherwise stated above. Pertinent History Reviewed:  Reviewed past medical,surgical, social,  obstetrical and family history.  Reviewed problem list, medications and allergies. Physical Assessment:   Vitals:   03/03/23 1411  BP: 136/86  Pulse: (!) 110  Weight: 207 lb 6.4 oz (94.1 kg)  Body mass index is 39.19 kg/m.           Physical Examination:   General appearance: alert, well appearing, and in no distress  Mental status: normal mood, behavior, speech, dress, motor activity, and thought processes  Skin: warm & dry   Extremities: Edema: Mild pitting, slight indentation    Cardiovascular: normal heart rate noted  Respiratory: normal respiratory effort, no distress  Abdomen: gravid, soft, non-tender  Pelvic: Cervical exam deferred         Fetal Status:     Movement: Present    Fetal Surveillance Testing today:  single active female fetus,  cephalic  FHR = 131bpm Anterior placenta  Gr 1    AFI = 15.6 cm  59%   SVP = 5.4 cm EFW 2350g  39%   AC 62% BPP = 8/8     UAD: RI 0.55   ~ 50%   Chaperone: N/A    Results for orders placed or performed in visit on 03/03/23 (from the past 24 hour(s))  POC Urinalysis Dipstick OB   Collection Time: 03/03/23  2:31 PM  Result Value Ref Range   Color, UA     Clarity, UA     Glucose, UA Negative  Negative   Bilirubin, UA     Ketones, UA trace    Spec Grav, UA     Blood, UA Negative    pH, UA     POC,PROTEIN,UA Negative Negative, Trace, Small (1+), Moderate (2+), Large (3+), 4+   Urobilinogen, UA     Nitrite, UA Negative    Leukocytes, UA Negative Negative   Appearance     Odor       Assessment & Plan:  High-risk pregnancy: G1P0000 at [redacted]w[redacted]d with an Estimated Date of Delivery: 04/12/23   1) Gest HTN -BPP 8/8, normal growth -continue antepartum testing -reviewed IOL around 37wk   Meds: No orders of the defined types were placed in this encounter.   Labs/procedures today: BPP/growth  Treatment Plan:  routine OB care and as outlined above  Reviewed: Preterm labor symptoms and general obstetric precautions including but  not limited to vaginal bleeding, contractions, leaking of fluid and fetal movement were reviewed in detail with the patient.  All questions were answered. Pt has home bp cuff. Check bp weekly, let us know if >140/90.   Follow-up: Return in about 1 week (around 03/10/2023) for HROB visit (weekly with BPP as scheduled).   Future Appointments  Date Time Provider Department Center  03/06/2023  1:50 PM [facility]FT[facility] NURSE [facility]  03/10/2023  1:30 PM [facility] - FT IMG 2 [facility]FTIMG None  03/10/2023  2:50 PM Lazaro Arms, MD [facility] FTO[facility]YN  03/13/2023  2:30 PM [facility]FTOBGYN NURSE [facility]TO[facility]  03/17/2023  2:15 PM [facility] - FT IMG 2 [facility]FTIMG None  03/17/2023  3:10 PM Lazaro Arms, MD [facility] FTOBGYN  03/20/2023  2:30 PM [facility]FTOBGYN NURSE [facility] FTO[facility]YN  03/25/2023 12:00 AM [facility] SCHED ROOM [facility] None    Orders Placed This Encounter  Procedures   POC Urinalysis Dipstick OB    Myna Hidalgo, DO Attending Obstetrician & Gynecologist, Faculty Practice Center for [employer], [facility]

## 2023-03-06 ENCOUNTER — Ambulatory Visit: Payer: 59 | Admitting: *Deleted

## 2023-03-06 VITALS — BP 135/83 | HR 110 | Wt 207.0 lb

## 2023-03-06 DIAGNOSIS — O0993 Supervision of high risk pregnancy, unspecified, third trimester: Secondary | ICD-10-CM | POA: Diagnosis not present

## 2023-03-06 DIAGNOSIS — Z3A34 34 weeks gestation of pregnancy: Secondary | ICD-10-CM | POA: Diagnosis not present

## 2023-03-06 LAB — POCT URINALYSIS DIPSTICK OB
Blood, UA: NEGATIVE
Glucose, UA: NEGATIVE
Ketones, UA: NEGATIVE
Leukocytes, UA: NEGATIVE
Nitrite, UA: NEGATIVE
POC,PROTEIN,UA: NEGATIVE

## 2023-03-06 NOTE — Progress Notes (Signed)
   NURSE VISIT- NST  SUBJECTIVE:  Norma Aguilar is a 22 y.o. G1P0000 female at [redacted]w[redacted]d, here for a NST for pregnancy complicated by [facility].  She reports active fetal movement, contractions: none, vaginal bleeding: none, membranes: intact.   OBJECTIVE:  BP 135/83   Pulse (!) 110   Wt 207 lb (93.9 kg)   LMP 06/24/2022 (Approximate)   BMI 39.11 kg/m   Appears well, no apparent distress  Results for orders placed or performed in visit on 03/06/23 (from the past 24 hour(s))  POC Urinalysis Dipstick OB   Collection Time: 03/06/23  2:15 PM  Result Value Ref Range   Color, UA     Clarity, UA     Glucose, UA Negative Negative   Bilirubin, UA     Ketones, UA neg    Spec Grav, UA     Blood, UA neg    pH, UA     POC,PROTEIN,UA Negative Negative, Trace, Small (1+), Moderate (2+), Large (3+), 4+   Urobilinogen, UA     Nitrite, UA neg    Leukocytes, UA Negative Negative   Appearance     Odor      NST: FHR baseline 120 bpm, Variability: moderate, Accelerations:not present, Decelerations:  Absent= Cat 1/reactive Toco: none   ASSESSMENT: G1P0000 at [redacted]w[redacted]d with GHTN NST reactive  PLAN: EFM strip reviewed by Dr. Charlotta Newton   Recommendations: keep next appointment as scheduled    Annamarie Dawley  03/06/2023 2:32 PM

## 2023-03-10 ENCOUNTER — Other Ambulatory Visit (INDEPENDENT_AMBULATORY_CARE_PROVIDER_SITE_OTHER): Payer: 59 | Admitting: Radiology

## 2023-03-10 ENCOUNTER — Encounter: Payer: Self-pay | Admitting: Obstetrics & Gynecology

## 2023-03-10 ENCOUNTER — Ambulatory Visit (INDEPENDENT_AMBULATORY_CARE_PROVIDER_SITE_OTHER): Payer: 59 | Admitting: Obstetrics & Gynecology

## 2023-03-10 VITALS — BP 139/82 | HR 102 | Wt 211.0 lb

## 2023-03-10 DIAGNOSIS — O0993 Supervision of high risk pregnancy, unspecified, third trimester: Secondary | ICD-10-CM

## 2023-03-10 DIAGNOSIS — Z3A35 35 weeks gestation of pregnancy: Secondary | ICD-10-CM

## 2023-03-10 DIAGNOSIS — O133 Gestational [pregnancy-induced] hypertension without significant proteinuria, third trimester: Secondary | ICD-10-CM

## 2023-03-10 DIAGNOSIS — O0992 Supervision of high risk pregnancy, unspecified, second trimester: Secondary | ICD-10-CM

## 2023-03-10 DIAGNOSIS — O132 Gestational [pregnancy-induced] hypertension without significant proteinuria, second trimester: Secondary | ICD-10-CM

## 2023-03-10 NOTE — Progress Notes (Signed)
[place] 35+2 by ultrasound  Single active female fetus,  cephalic    FHR 132bpm BPP = 8/8   Anterior placenta Gr 1    AFI = 16.4cm   64%   SVP = 5.1cm UAD RI = 0.64 and 0.65   ~75% Neg adnexal regions - neg CDS - no free fluid

## 2023-03-10 NOTE — Progress Notes (Signed)
HIGH-RISK PREGNANCY VISIT Patient name: Norma Aguilar MRN [id number]  Date of birth: [birth year] Chief Complaint:   Routine Prenatal Visit  History of Present Illness:   Norma Kizer is a 22 y.o. G27P0000 female at [redacted]w[redacted]d with an Estimated Date of Delivery: 04/12/23 being seen today for ongoing management of a high-risk pregnancy complicated by gHTN Dx 25w on nifedipine 30 mg qd.    Today she reports no complaints. Contractions: Not present. Vag. Bleeding: None.  Movement: Present. denies leaking of fluid.      10/01/2022    3:25 PM 07/18/2022   10:22 AM 12/07/2015    1:48 PM 01/19/2014    2:01 PM  Depression screen PHQ 2/9  Decreased Interest 0 0    Down, Depressed, Hopeless 0 0    PHQ - 2 Score 0 0    Altered sleeping 3 0    Tired, decreased energy 3 0    Change in appetite 3 0    Feeling bad or failure about yourself  0 0    Trouble concentrating 1 0    Moving slowly or fidgety/restless 0 0    Suicidal thoughts 0 0    PHQ-9 Score 10 0       Information is confidential and restricted. Go to Review Flowsheets to unlock data.        10/01/2022    3:25 PM 07/18/2022   10:22 AM 12/07/2015    1:50 PM  GAD 7 : Generalized Anxiety Score  Nervous, Anxious, on Edge 2 1   Control/stop worrying 1 1   Worry too much - different things 1 0   Trouble relaxing 0 0   Restless 0 0   Easily annoyed or irritable 2 0   Afraid - awful might happen 1 0   Total GAD 7 Score 7 2   Anxiety Difficulty        Information is confidential and restricted. Go to Review Flowsheets to unlock data.     Review of Systems:   Pertinent items are noted in HPI Denies abnormal vaginal discharge w/ itching/odor/irritation, headaches, visual changes, shortness of breath, chest pain, abdominal pain, severe nausea/vomiting, or problems with urination or bowel movements unless otherwise stated above. Pertinent History Reviewed:  Reviewed past medical,surgical, social, obstetrical and family history.  Reviewed  problem list, medications and allergies. Physical Assessment:   Vitals:   03/10/23 1355  BP: 139/82  Pulse: (!) 102  Weight: 211 lb (95.7 kg)  Body mass index is 39.87 kg/m.           Physical Examination:   General appearance: alert, well appearing, and in no distress  Mental status: alert, oriented to person, place, and time  Skin: warm & dry   Extremities:      Cardiovascular: normal heart rate noted  Respiratory: normal respiratory effort, no distress  Abdomen: gravid, soft, non-tender  Pelvic: Cervical exam deferred         Fetal Status:     Movement: Present    Fetal Surveillance Testing today: BPP 8/8   Chaperone:     No results found for this or any previous visit (from the past 24 hour(s)).  Assessment & Plan:  High-risk pregnancy: G1P0000 at [redacted]w[redacted]d with an Estimated Date of Delivery: 04/12/23      ICD-10-CM   1. Supervision of high risk pregnancy in third trimester  O09.93     2. Gestational hypertension, third trimester: on Procardia xl 30 mg  O13.3  Meds: No orders of the defined types were placed in this encounter.   Orders: No orders of the defined types were placed in this encounter.    Labs/procedures today: U/S  Treatment Plan:  cont twice weekly  Reviewed: Preterm labor symptoms and general obstetric precautions including but not limited to vaginal bleeding, contractions, leaking of fluid and fetal movement were reviewed in detail with the patient.  All questions were answered. Does have home bp cuff. Office bp cuff given: not applicable. Check bp twice daily, let us know if consistently >150 and/or >95.  Follow-up: No follow-ups on file.   Future Appointments  Date Time Provider Department Center  03/10/2023  2:50 PM Lazaro Arms, MD [facility]  03/13/2023  2:30 PM [facility] NURSE [facility]  03/17/2023  2:15 PM [facility] 2 [facility] None  03/17/2023  3:10 PM Lazaro Arms, MD [facility]  03/20/2023  2:30 PM  [facility] NURSE [facility]  03/25/2023 12:00 AM [facility] SCHED ROOM [facility] None    No orders of the defined types were placed in this encounter.  Lazaro Arms  Attending Physician for the Center for [facility] 03/10/2023 2:27 PM

## 2023-03-11 ENCOUNTER — Other Ambulatory Visit: Payer: Self-pay | Admitting: Women's Health

## 2023-03-11 DIAGNOSIS — Z23 Encounter for immunization: Secondary | ICD-10-CM

## 2023-03-11 DIAGNOSIS — O132 Gestational [pregnancy-induced] hypertension without significant proteinuria, second trimester: Secondary | ICD-10-CM

## 2023-03-11 DIAGNOSIS — O0990 Supervision of high risk pregnancy, unspecified, unspecified trimester: Secondary | ICD-10-CM

## 2023-03-11 DIAGNOSIS — O0992 Supervision of high risk pregnancy, unspecified, second trimester: Secondary | ICD-10-CM

## 2023-03-13 ENCOUNTER — Ambulatory Visit: Payer: 59

## 2023-03-13 VITALS — BP 133/83 | HR 91 | Wt 210.0 lb

## 2023-03-13 DIAGNOSIS — O0993 Supervision of high risk pregnancy, unspecified, third trimester: Secondary | ICD-10-CM

## 2023-03-13 DIAGNOSIS — Z3A35 35 weeks gestation of pregnancy: Secondary | ICD-10-CM | POA: Diagnosis not present

## 2023-03-13 DIAGNOSIS — O133 Gestational [pregnancy-induced] hypertension without significant proteinuria, third trimester: Secondary | ICD-10-CM

## 2023-03-13 DIAGNOSIS — O0990 Supervision of high risk pregnancy, unspecified, unspecified trimester: Secondary | ICD-10-CM

## 2023-03-13 LAB — POCT URINALYSIS DIPSTICK OB
Blood, UA: NEGATIVE
Glucose, UA: NEGATIVE
Leukocytes, UA: NEGATIVE
Nitrite, UA: NEGATIVE
POC,PROTEIN,UA: NEGATIVE

## 2023-03-13 NOTE — Progress Notes (Addendum)
   NURSE VISIT- NST  SUBJECTIVE:  Norma Aguilar is a 22 y.o. G1P0000 female at [redacted]w[redacted]d, here for a NST for pregnancy complicated by [facility].  She reports active fetal movement, contractions: none, vaginal bleeding: none, membranes: intact.   OBJECTIVE:  BP 133/83   Pulse 91   Wt 210 lb (95.3 kg)   LMP 06/24/2022 (Approximate)   BMI 39.68 kg/m   Appears well, no apparent distress  Results for orders placed or performed in visit on 03/13/23 (from the past 24 hour(s))  POC Urinalysis Dipstick OB   Collection Time: 03/13/23  3:55 PM  Result Value Ref Range   Color, UA     Clarity, UA     Glucose, UA Negative Negative   Bilirubin, UA     Ketones, UA Trace    Spec Grav, UA     Blood, UA Negative    pH, UA     POC,PROTEIN,UA Negative Negative, Trace, Small (1+), Moderate (2+), Large (3+), 4+   Urobilinogen, UA     Nitrite, UA Negative    Leukocytes, UA Negative Negative   Appearance     Odor      NST: FHR baseline 110 bpm, Variability: moderate, Accelerations:present, Decelerations:  Absent= Cat 1/reactive Toco: none   ASSESSMENT: G1P0000 at [redacted]w[redacted]d with GHTN NST reactive  PLAN: EFM strip reviewed by Norma Aguilar, CNM   Recommendations: keep next appointment as scheduled    Norma Aguilar  03/13/2023 4:00 PM   Chart reviewed for nurse visit. Agree with plan of care.  Jacklyn Shell, [place] 03/14/2023 6:51 AM

## 2023-03-17 ENCOUNTER — Ambulatory Visit (INDEPENDENT_AMBULATORY_CARE_PROVIDER_SITE_OTHER): Payer: 59 | Admitting: Radiology

## 2023-03-17 ENCOUNTER — Ambulatory Visit (INDEPENDENT_AMBULATORY_CARE_PROVIDER_SITE_OTHER): Payer: 59 | Admitting: Obstetrics & Gynecology

## 2023-03-17 ENCOUNTER — Encounter: Payer: Self-pay | Admitting: Obstetrics & Gynecology

## 2023-03-17 ENCOUNTER — Other Ambulatory Visit (HOSPITAL_COMMUNITY)
Admission: RE | Admit: 2023-03-17 | Discharge: 2023-03-17 | Disposition: A | Discharge status: Home / Self Care | Payer: 59 | Source: Ambulatory Visit | Attending: Obstetrics & Gynecology | Admitting: Obstetrics & Gynecology

## 2023-03-17 VITALS — BP 129/85 | HR 100 | Wt 210.0 lb

## 2023-03-17 DIAGNOSIS — O133 Gestational [pregnancy-induced] hypertension without significant proteinuria, third trimester: Secondary | ICD-10-CM | POA: Diagnosis not present

## 2023-03-17 DIAGNOSIS — Z3A36 36 weeks gestation of pregnancy: Secondary | ICD-10-CM

## 2023-03-17 DIAGNOSIS — O0992 Supervision of high risk pregnancy, unspecified, second trimester: Secondary | ICD-10-CM

## 2023-03-17 DIAGNOSIS — O0990 Supervision of high risk pregnancy, unspecified, unspecified trimester: Secondary | ICD-10-CM

## 2023-03-17 DIAGNOSIS — O132 Gestational [pregnancy-induced] hypertension without significant proteinuria, second trimester: Secondary | ICD-10-CM

## 2023-03-17 DIAGNOSIS — O0993 Supervision of high risk pregnancy, unspecified, third trimester: Secondary | ICD-10-CM | POA: Diagnosis not present

## 2023-03-17 DIAGNOSIS — O99213 Obesity complicating pregnancy, third trimester: Secondary | ICD-10-CM

## 2023-03-17 LAB — POCT URINALYSIS DIPSTICK OB
Blood, UA: NEGATIVE
Glucose, UA: NEGATIVE
Ketones, UA: NEGATIVE
Nitrite, UA: NEGATIVE
POC,PROTEIN,UA: NEGATIVE

## 2023-03-17 NOTE — Progress Notes (Signed)
HIGH-RISK PREGNANCY VISIT Patient name: Norma Aguilar MRN [id number]  Date of birth: Sep 19, 2000 Chief Complaint:   High Risk Gestation ([place] today!! GBS, GC/CHL today!!!)  History of Present Illness:   Norma Barrantes is a 22 y.o. G58P0000 female at [redacted]w[redacted]d with an Estimated Date of Delivery: 04/12/23 being seen today for ongoing management of a high-risk pregnancy complicated by gHTN .    Today she reports no complaints. Contractions: Irritability. Vag. Bleeding: None.  Movement: Present. denies leaking of fluid.      10/01/2022    3:25 PM 07/18/2022   10:22 AM 12/07/2015    1:48 PM 01/19/2014    2:01 PM  Depression screen PHQ 2/9  Decreased Interest 0 0    Down, Depressed, Hopeless 0 0    PHQ - 2 Score 0 0    Altered sleeping 3 0    Tired, decreased energy 3 0    Change in appetite 3 0    Feeling bad or failure about yourself  0 0    Trouble concentrating 1 0    Moving slowly or fidgety/restless 0 0    Suicidal thoughts 0 0    PHQ-9 Score 10 0       Information is confidential and restricted. Go to Review Flowsheets to unlock data.        10/01/2022    3:25 PM 07/18/2022   10:22 AM 12/07/2015    1:50 PM  GAD 7 : Generalized Anxiety Score  Nervous, Anxious, on Edge 2 1   Control/stop worrying 1 1   Worry too much - different things 1 0   Trouble relaxing 0 0   Restless 0 0   Easily annoyed or irritable 2 0   Afraid - awful might happen 1 0   Total GAD 7 Score 7 2   Anxiety Difficulty        Information is confidential and restricted. Go to Review Flowsheets to unlock data.     Review of Systems:   Pertinent items are noted in HPI Denies abnormal vaginal discharge w/ itching/odor/irritation, headaches, visual changes, shortness of breath, chest pain, abdominal pain, severe nausea/vomiting, or problems with urination or bowel movements unless otherwise stated above. Pertinent History Reviewed:  Reviewed past medical,surgical, social, obstetrical and family history.   Reviewed problem list, medications and allergies. Physical Assessment:   Vitals:   03/17/23 1510  BP: 129/85  Pulse: 100  Weight: 210 lb (95.3 kg)  Body mass index is 39.68 kg/m.           Physical Examination:   General appearance: alert, well appearing, and in no distress  Mental status: alert, oriented to person, place, and time  Skin: warm & dry   Extremities: Edema: Trace    Cardiovascular: normal heart rate noted  Respiratory: normal respiratory effort, no distress  Abdomen: gravid, soft, non-tender  Pelvic: Cervical exam performed       LTC  Fetal Status:     Movement: Present    Fetal Surveillance Testing today: BPP 8/8 with normal UAD   Chaperone: N/A    Results for orders placed or performed in visit on 03/17/23 (from the past 24 hour(s))  POC Urinalysis Dipstick OB   Collection Time: 03/17/23  3:05 PM  Result Value Ref Range   Color, UA     Clarity, UA     Glucose, UA Negative Negative   Bilirubin, UA     Ketones, UA neg    Spec Grav, UA  Blood, UA neg    pH, UA     POC,PROTEIN,UA Negative Negative, Trace, Small (1+), Moderate (2+), Large (3+), 4+   Urobilinogen, UA     Nitrite, UA neg    Leukocytes, UA Trace (A) Negative   Appearance     Odor      Assessment & Plan:  High-risk pregnancy: G1P0000 at [redacted]w[redacted]d with an Estimated Date of Delivery: 04/12/23      ICD-10-CM   1. Supervision of high risk pregnancy, antepartum  O09.90 POC Urinalysis Dipstick OB    Cervicovaginal ancillary only( [place])    Strep Gp B NAA    2. Gestational hypertension, third trimester  O13.3     3. [redacted] weeks gestation of pregnancy  Z3A.36 POC Urinalysis Dipstick OB    Cervicovaginal ancillary only( [place])    Strep Gp B NAA        Meds: No orders of the defined types were placed in this encounter.   Orders:  Orders Placed This Encounter  Procedures   Strep Gp B NAA   POC Urinalysis Dipstick OB     Labs/procedures today: U/S  Treatment Plan:  twice  weekly surveillance, IOL 03/1923  Reviewed: Preterm labor symptoms and general obstetric precautions including but not limited to vaginal bleeding, contractions, leaking of fluid and fetal movement were reviewed in detail with the patient.  All questions were answered. Does have home bp cuff. Office bp cuff given: not applicable. Check bp twice daily, let us know if consistently >140 and/or >90.  Follow-up: No follow-ups on file.   Future Appointments  Date Time Provider Department Center  03/20/2023  2:30 PM [facility] NURSE [facility]  03/25/2023 12:00 AM [facility] SCHED ROOM [facility] None    Orders Placed This Encounter  Procedures   Strep Gp B NAA   POC Urinalysis Dipstick OB   Lazaro Arms  Attending Physician for the Center for [facility] 03/17/2023 3:37 PM

## 2023-03-17 NOTE — Progress Notes (Signed)
GA 36+2 by early u/s Single viable active female fetus, Cephalic     FHR 169CVE Anterior pl,  gr 1  AFI = 12.5 cm  37%,   SVP = 5.7cm BPP = 8/8,   Umb cord dopplers:  RI = 0.63  ~ 68% Normal ovaries  -  limited view of cervix

## 2023-03-18 ENCOUNTER — Telehealth (HOSPITAL_COMMUNITY): Payer: Self-pay | Admitting: *Deleted

## 2023-03-18 ENCOUNTER — Other Ambulatory Visit: Payer: Self-pay | Admitting: Advanced Practice Midwife

## 2023-03-18 ENCOUNTER — Encounter (HOSPITAL_COMMUNITY): Payer: Self-pay

## 2023-03-18 NOTE — Telephone Encounter (Signed)
Preadmission screen  

## 2023-03-19 ENCOUNTER — Encounter (HOSPITAL_COMMUNITY): Payer: Self-pay | Admitting: *Deleted

## 2023-03-19 ENCOUNTER — Telehealth (HOSPITAL_COMMUNITY): Payer: Self-pay | Admitting: *Deleted

## 2023-03-19 LAB — CERVICOVAGINAL ANCILLARY ONLY
Chlamydia: NEGATIVE
Comment: NEGATIVE
Comment: NORMAL
Neisseria Gonorrhea: NEGATIVE

## 2023-03-19 LAB — STREP GP B NAA: Strep Gp B NAA: NEGATIVE

## 2023-03-19 NOTE — Telephone Encounter (Signed)
Preadmission screen  

## 2023-03-20 ENCOUNTER — Ambulatory Visit: Payer: 59 | Admitting: *Deleted

## 2023-03-20 VITALS — BP 124/81 | HR 99 | Wt 212.0 lb

## 2023-03-20 DIAGNOSIS — O0990 Supervision of high risk pregnancy, unspecified, unspecified trimester: Secondary | ICD-10-CM

## 2023-03-20 DIAGNOSIS — Z1389 Encounter for screening for other disorder: Secondary | ICD-10-CM

## 2023-03-20 DIAGNOSIS — O0993 Supervision of high risk pregnancy, unspecified, third trimester: Secondary | ICD-10-CM

## 2023-03-20 DIAGNOSIS — O133 Gestational [pregnancy-induced] hypertension without significant proteinuria, third trimester: Secondary | ICD-10-CM | POA: Diagnosis not present

## 2023-03-20 DIAGNOSIS — Z3A36 36 weeks gestation of pregnancy: Secondary | ICD-10-CM

## 2023-03-20 DIAGNOSIS — Z331 Pregnant state, incidental: Secondary | ICD-10-CM

## 2023-03-20 DIAGNOSIS — O288 Other abnormal findings on antenatal screening of mother: Secondary | ICD-10-CM

## 2023-03-20 LAB — POCT URINALYSIS DIPSTICK OB
Blood, UA: NEGATIVE
Glucose, UA: NEGATIVE
Ketones, UA: NEGATIVE
Leukocytes, UA: NEGATIVE
Nitrite, UA: NEGATIVE

## 2023-03-20 NOTE — Progress Notes (Signed)
   NURSE VISIT- NST  SUBJECTIVE:  Norma Aguilar is a 22 y.o. G1P0000 female at [redacted]w[redacted]d, here for a NST for pregnancy complicated by [facility].  She reports active fetal movement, contractions: none, vaginal bleeding: none, membranes: intact.   OBJECTIVE:  BP 124/81   Pulse 99   Wt 212 lb (96.2 kg)   LMP 06/24/2022 (Approximate)   BMI 40.06 kg/m   Appears well, no apparent distress  Results for orders placed or performed in visit on 03/20/23 (from the past 24 hour(s))  POC Urinalysis Dipstick OB   Collection Time: 03/20/23  3:26 PM  Result Value Ref Range   Color, UA     Clarity, UA     Glucose, UA Negative Negative   Bilirubin, UA     Ketones, UA negative    Spec Grav, UA     Blood, UA negative    pH, UA     POC,PROTEIN,UA Trace Negative, Trace, Small (1+), Moderate (2+), Large (3+), 4+   Urobilinogen, UA     Nitrite, UA negative    Leukocytes, UA Negative Negative   Appearance     Odor      NST: FHR baseline 130 bpm, Variability: moderate, Accelerations:present, Decelerations:  Absent= Cat 1/reactive Toco: none   ASSESSMENT: G1P0000 at [redacted]w[redacted]d with GHTN NST reactive  PLAN: EFM strip reviewed by Dr. Charlotta Newton   Recommendations: keep next appointment as scheduled    Jobe Marker  03/20/2023 3:31 PM

## 2023-03-25 ENCOUNTER — Encounter (HOSPITAL_COMMUNITY): Payer: Self-pay | Admitting: Obstetrics and Gynecology

## 2023-03-25 ENCOUNTER — Inpatient Hospital Stay (HOSPITAL_COMMUNITY): Payer: 59 | Admitting: Anesthesiology

## 2023-03-25 ENCOUNTER — Inpatient Hospital Stay (HOSPITAL_COMMUNITY)
Admission: RE | Admit: 2023-03-25 | Discharge: 2023-03-27 | DRG: 807 | Disposition: A | Discharge status: Home / Self Care | Payer: 59 | Attending: Obstetrics and Gynecology | Admitting: Obstetrics and Gynecology

## 2023-03-25 ENCOUNTER — Inpatient Hospital Stay (HOSPITAL_COMMUNITY): Payer: 59

## 2023-03-25 DIAGNOSIS — O99344 Other mental disorders complicating childbirth: Secondary | ICD-10-CM | POA: Diagnosis not present

## 2023-03-25 DIAGNOSIS — Z833 Family history of diabetes mellitus: Secondary | ICD-10-CM

## 2023-03-25 DIAGNOSIS — Z3A37 37 weeks gestation of pregnancy: Secondary | ICD-10-CM

## 2023-03-25 DIAGNOSIS — Z825 Family history of asthma and other chronic lower respiratory diseases: Secondary | ICD-10-CM | POA: Diagnosis not present

## 2023-03-25 DIAGNOSIS — F411 Generalized anxiety disorder: Secondary | ICD-10-CM | POA: Diagnosis present

## 2023-03-25 DIAGNOSIS — E66813 Obesity, class 3: Secondary | ICD-10-CM | POA: Diagnosis present

## 2023-03-25 DIAGNOSIS — O99214 Obesity complicating childbirth: Secondary | ICD-10-CM | POA: Diagnosis present

## 2023-03-25 DIAGNOSIS — O139 Gestational [pregnancy-induced] hypertension without significant proteinuria, unspecified trimester: Secondary | ICD-10-CM | POA: Diagnosis present

## 2023-03-25 DIAGNOSIS — Z823 Family history of stroke: Secondary | ICD-10-CM | POA: Diagnosis not present

## 2023-03-25 DIAGNOSIS — O134 Gestational [pregnancy-induced] hypertension without significant proteinuria, complicating childbirth: Principal | ICD-10-CM | POA: Diagnosis present

## 2023-03-25 DIAGNOSIS — Z8249 Family history of ischemic heart disease and other diseases of the circulatory system: Secondary | ICD-10-CM | POA: Diagnosis not present

## 2023-03-25 DIAGNOSIS — O0990 Supervision of high risk pregnancy, unspecified, unspecified trimester: Principal | ICD-10-CM

## 2023-03-25 LAB — CBC
HCT: 34.5 % — ABNORMAL LOW (ref 36.0–46.0)
HCT: 37.7 % (ref 36.0–46.0)
Hemoglobin: 11.8 g/dL — ABNORMAL LOW (ref 12.0–15.0)
Hemoglobin: 12.4 g/dL (ref 12.0–15.0)
MCH: 30.3 pg (ref 26.0–34.0)
MCH: 29.7 pg (ref 26.0–34.0)
MCHC: 34.2 g/dL (ref 30.0–36.0)
MCHC: 32.9 g/dL (ref 30.0–36.0)
MCV: 88.7 fL (ref 80.0–100.0)
MCV: 90.4 fL (ref 80.0–100.0)
Platelets: 190 10*3/uL (ref 150–400)
Platelets: 195 10*3/uL (ref 150–400)
RBC: 3.89 MIL/uL (ref 3.87–5.11)
RBC: 4.17 MIL/uL (ref 3.87–5.11)
RDW: 12.8 % (ref 11.5–15.5)
RDW: 12.9 % (ref 11.5–15.5)
WBC: 12.8 10*3/uL — ABNORMAL HIGH (ref 4.0–10.5)
WBC: 13.5 10*3/uL — ABNORMAL HIGH (ref 4.0–10.5)
nRBC: 0 % (ref 0.0–0.2)
nRBC: 0 % (ref 0.0–0.2)

## 2023-03-25 LAB — TYPE AND SCREEN
ABO/RH(D): O POS
Antibody Screen: NEGATIVE

## 2023-03-25 LAB — COMPREHENSIVE METABOLIC PANEL
ALT: 20 U/L (ref 0–44)
AST: 21 U/L (ref 15–41)
Albumin: 3.1 g/dL — ABNORMAL LOW (ref 3.5–5.0)
Alkaline Phosphatase: 108 U/L (ref 38–126)
Anion gap: 10 (ref 5–15)
BUN: 9 mg/dL (ref 6–20)
CO2: 19 mmol/L — ABNORMAL LOW (ref 22–32)
Calcium: 8.6 mg/dL — ABNORMAL LOW (ref 8.9–10.3)
Chloride: 105 mmol/L (ref 98–111)
Creatinine, Ser: 0.49 mg/dL (ref 0.44–1.00)
GFR, Estimated: >60 mL/min (ref >60)
Glucose, Bld: 80 mg/dL (ref 70–99)
Potassium: 3.4 mmol/L — ABNORMAL LOW (ref 3.5–5.1)
Sodium: 134 mmol/L — ABNORMAL LOW (ref 135–145)
Total Bilirubin: 0.3 mg/dL (ref <1.2)
Total Protein: 6.1 g/dL — ABNORMAL LOW (ref 6.5–8.1)

## 2023-03-25 LAB — PROTEIN / CREATININE RATIO, URINE
Creatinine, Urine: 19 mg/dL
Total Protein, Urine: <6 mg/dL

## 2023-03-25 LAB — HIV ANTIBODY (ROUTINE TESTING W REFLEX): HIV Screen 4th Generation wRfx: NONREACTIVE

## 2023-03-25 LAB — RPR: RPR Ser Ql: NONREACTIVE

## 2023-03-25 MED ORDER — TERBUTALINE SULFATE 1 MG/ML IJ SOLN: 0.2500 mg | Freq: Once | INTRAMUSCULAR | Status: DC | PRN

## 2023-03-25 MED ORDER — ONDANSETRON HCL 4 MG/2ML IJ SOLN: 4.0000 mg | Freq: Four times a day (QID) | INTRAMUSCULAR | Status: DC | PRN

## 2023-03-25 MED ORDER — NIFEDIPINE ER OSMOTIC RELEASE 30 MG PO TB24
30.0000 mg | ORAL_TABLET | Freq: Every day | ORAL | Status: DC
  Administered 2023-03-25: 30 mg via ORAL
  Filled 2023-03-25: qty 1

## 2023-03-25 MED ORDER — TRANEXAMIC ACID-NACL 1000-0.7 MG/100ML-% IV SOLN
INTRAVENOUS | Status: AC
  Administered 2023-03-25: 1000 mg
  Filled 2023-03-25: qty 100

## 2023-03-25 MED ORDER — OXYTOCIN-SODIUM CHLORIDE 30-0.9 UT/500ML-% IV SOLN
2.5000 [IU]/h | INTRAVENOUS | Status: DC
Start: 2023-03-25 — End: 2023-03-26
  Administered 2023-03-25: 2.5 [IU]/h via INTRAVENOUS
  Filled 2023-03-25: qty 500

## 2023-03-25 MED ORDER — MISOPROSTOL 50MCG HALF TABLET
50.0000 ug | ORAL_TABLET | ORAL | Status: DC | PRN
  Administered 2023-03-25: 50 ug via ORAL
  Filled 2023-03-25: qty 1

## 2023-03-25 MED ORDER — PHENYLEPHRINE 80 MCG/ML (10ML) SYRINGE FOR IV PUSH (FOR BLOOD PRESSURE SUPPORT): 80.0000 ug | PREFILLED_SYRINGE | INTRAVENOUS | Status: DC | PRN

## 2023-03-25 MED ORDER — PHENYLEPHRINE 80 MCG/ML (10ML) SYRINGE FOR IV PUSH (FOR BLOOD PRESSURE SUPPORT)
80.0000 ug | PREFILLED_SYRINGE | INTRAVENOUS | Status: DC | PRN
  Filled 2023-03-25: qty 10

## 2023-03-25 MED ORDER — FENTANYL CITRATE (PF) 100 MCG/2ML IJ SOLN
50.0000 ug | INTRAMUSCULAR | Status: DC | PRN
  Administered 2023-03-25: 100 ug via INTRAVENOUS
  Filled 2023-03-25: qty 2

## 2023-03-25 MED ORDER — EPHEDRINE 5 MG/ML INJ: 10.0000 mg | INTRAVENOUS | Status: DC | PRN

## 2023-03-25 MED ORDER — FENTANYL-BUPIVACAINE-NACL 0.5-0.125-0.9 MG/250ML-% EP SOLN
12.0000 mL/h | EPIDURAL | Status: DC | PRN
  Filled 2023-03-25: qty 250

## 2023-03-25 MED ORDER — OXYCODONE-ACETAMINOPHEN 5-325 MG PO TABS: 2.0000 | ORAL_TABLET | ORAL | Status: DC | PRN

## 2023-03-25 MED ORDER — TRANEXAMIC ACID-NACL 1000-0.7 MG/100ML-% IV SOLN
1000.0000 mg | INTRAVENOUS | Status: DC
Start: 2023-03-26 — End: 2023-03-26
  Administered 2023-03-25: 1000 mg via INTRAVENOUS

## 2023-03-25 MED ORDER — SOD CITRATE-CITRIC ACID 500-334 MG/5ML PO SOLN: 30.0000 mL | ORAL | Status: DC | PRN

## 2023-03-25 MED ORDER — OXYTOCIN-SODIUM CHLORIDE 30-0.9 UT/500ML-% IV SOLN
1.0000 m[IU]/min | INTRAVENOUS | Status: DC
  Administered 2023-03-25: 2 m[IU]/min via INTRAVENOUS
  Administered 2023-03-25: 8 m[IU]/min via INTRAVENOUS

## 2023-03-25 MED ORDER — MISOPROSTOL 25 MCG QUARTER TABLET
25.0000 ug | ORAL_TABLET | Freq: Once | ORAL | Status: AC
  Administered 2023-03-25: 25 ug via ORAL
  Filled 2023-03-25: qty 1

## 2023-03-25 MED ORDER — LACTATED RINGERS IV SOLN: 500.0000 mL | INTRAVENOUS | Status: DC | PRN

## 2023-03-25 MED ORDER — OXYCODONE-ACETAMINOPHEN 5-325 MG PO TABS: 1.0000 | ORAL_TABLET | ORAL | Status: DC | PRN

## 2023-03-25 MED ORDER — LACTATED RINGERS IV SOLN
INTRAVENOUS | Status: DC
Start: 2023-03-25 — End: 2023-03-26

## 2023-03-25 MED ORDER — MISOPROSTOL 25 MCG QUARTER TABLET
25.0000 ug | ORAL_TABLET | ORAL | Status: DC | PRN
  Administered 2023-03-25: 25 ug via ORAL
  Filled 2023-03-25: qty 1

## 2023-03-25 MED ORDER — ACETAMINOPHEN 325 MG PO TABS
650.0000 mg | ORAL_TABLET | ORAL | Status: DC | PRN
  Administered 2023-03-25: 650 mg via ORAL
  Filled 2023-03-25: qty 2

## 2023-03-25 MED ORDER — LACTATED RINGERS IV SOLN: 500.0000 mL | Freq: Once | INTRAVENOUS | Status: DC

## 2023-03-25 MED ORDER — LACTATED RINGERS AMNIOINFUSION: INTRAVENOUS | Status: DC

## 2023-03-25 MED ORDER — OXYTOCIN BOLUS FROM INFUSION
333.0000 mL | Freq: Once | INTRAVENOUS | Status: AC
  Administered 2023-03-25: 333 mL via INTRAVENOUS

## 2023-03-25 MED ORDER — DIPHENHYDRAMINE HCL 50 MG/ML IJ SOLN
12.5000 mg | INTRAMUSCULAR | Status: DC | PRN
  Administered 2023-03-25: 12.5 mg via INTRAVENOUS
  Filled 2023-03-25: qty 1

## 2023-03-25 MED ORDER — LIDOCAINE HCL (PF) 1 % IJ SOLN: 30.0000 mL | INTRAMUSCULAR | Status: DC | PRN

## 2023-03-25 MED ORDER — MISOPROSTOL 25 MCG QUARTER TABLET
25.0000 ug | ORAL_TABLET | Freq: Once | ORAL | Status: AC
  Administered 2023-03-25: 25 ug via VAGINAL
  Filled 2023-03-25: qty 1

## 2023-03-25 NOTE — Progress Notes (Signed)
Norma Aguilar is a 22 y.o. G1P0000 at [redacted]w[redacted]d by ultrasound admitted for induction of labor due to gestational hypertension.  Subjective: Pt resting comfortably in bed, no questions or complaints.   Objective: BP (!) 142/80   Pulse 99   Temp 97.7 F (36.5 C) (Oral)   Resp 16   Ht 5' (1.524 m)   Wt 96.6 kg   LMP 06/24/2022 (Approximate)   BMI 41.60 kg/m  No intake/output data recorded. No intake/output data recorded.  FHT:  FHR: 130 bpm, variability: moderate,  accelerations:  Present,  decelerations:  Absent UC:   irregular, every 3-5 minutes SVE:   Dilation: 4 Effacement (%): 70 Station: -3 Exam by:: Raliegh Ip RN  Labs: Lab Results  Component Value Date   WBC 12.8 (H) 03/25/2023   HGB 11.8 (L) 03/25/2023   HCT 34.5 (L) 03/25/2023   MCV 88.7 03/25/2023   PLT 190 03/25/2023    Assessment / Plan: Induction of labor due to gestational HTN, progressing well. Foley balloon fell out at 1200, will start Pitocin.  Labor: Giving first dose of pitocin now, will redose as indicated Fetal Wellbeing:  Category I Gestational HTN: procardia 30mg  daily Pain Control:  PRN IV pain meds and epidural if requested I/D:  n/a GBS negative Anticipated MOD:  NSVD  Lorayne Bender, MD 03/25/2023, 3:15 PM

## 2023-03-25 NOTE — Progress Notes (Incomplete)
Labor Progress Note Norma Aguilar is a 22 y.o. G1P0000 at [redacted]w[redacted]d presented for UIOL S:   O:  BP 119/76   Pulse 91   Temp 98.2 F (36.8 C) (Oral)   Resp 16   Ht 5' (1.524 m)   Wt 96.6 kg   LMP 06/24/2022 (Approximate)   BMI 41.60 kg/m  EFM: ***/***/***  CVE: Dilation: 5.5 Effacement (%): 70 Station: 0 Presentation: Vertex Exam by:: Dr. Lucianne Muss   A&P: 22 y.o. G1P0000 [redacted]w[redacted]d *** #Labor: Progressing well. *** #Pain:  #FWB: *** #GBS {pos/neg/not [id number]} *** (chronic/other problems)  Sundra Aland, MD 10:55 PM

## 2023-03-25 NOTE — Anesthesia Preprocedure Evaluation (Signed)
Anesthesia Evaluation  Patient identified by MRN, date of birth, ID band Patient awake    Reviewed: Allergy & Precautions, Patient's Chart, lab work & pertinent test results  Airway Mallampati: II  TM Distance: >3 FB Neck ROM: Full    Dental no notable dental hx.    Pulmonary neg pulmonary ROS   Pulmonary exam normal breath sounds clear to auscultation       Cardiovascular hypertension, Pt. on medications Normal cardiovascular exam Rhythm:Regular Rate:Normal     Neuro/Psych  PSYCHIATRIC DISORDERS Anxiety Depression    negative neurological ROS     GI/Hepatic negative GI ROS, Neg liver ROS,,,  Endo/Other    Class 3 obesityBMI 42  Renal/GU negative Renal ROS  negative genitourinary   Musculoskeletal negative musculoskeletal ROS (+)    Abdominal  (+) + obese  Peds negative pediatric ROS (+)  Hematology negative hematology ROS (+) Hb 12.8, plt 195   Anesthesia Other Findings   Reproductive/Obstetrics (+) Pregnancy                             Anesthesia Physical Anesthesia Plan  ASA: 3  Anesthesia Plan: Epidural   Post-op Pain Management:    Induction:   PONV Risk Score and Plan: 2  Airway Management Planned: Natural Airway  Additional Equipment: None  Intra-op Plan:   Post-operative Plan:   Informed Consent: I have reviewed the patients History and Physical, chart, labs and discussed the procedure including the risks, benefits and alternatives for the proposed anesthesia with the patient or authorized representative who has indicated his/her understanding and acceptance.       Plan Discussed with:   Anesthesia Plan Comments:        Anesthesia Quick Evaluation

## 2023-03-25 NOTE — H&P (Signed)
Norma Aguilar is a 22 y.o. female presenting for IOL for Gestational hypertension.   This was diagnosed at . 25 weeks  Blood pressure has been managed with Procardia XL (didn't tolerate Labetalol).  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   Oppositional defiant disorder 08/05/2011   Generalized anxiety disorder 08/05/2011    OB History     Gravida  1   Para  0   Term  0   Preterm  0   AB  0   Living  0      SAB  0   IAB  0   Ectopic  0   Multiple  0   Live Births  0          Past Medical History:  Diagnosis Date   ADHD (attention deficit hyperactivity disorder)    ADHD (attention deficit hyperactivity disorder)    Anxiety    Oppositional defiant disorder    Pregnancy induced hypertension    Past Surgical History:  Procedure Laterality Date   MOUTH SURGERY     Family History: family history includes ADD / ADHD in her cousin; Bipolar disorder in her mother; COPD in her paternal grandmother; Diabetes in her paternal grandfather; Gallbladder disease in her paternal grandfather and paternal uncle; Heart attack in her paternal grandfather; Liver disease in her father; Stroke (age of onset: 58) in her father. Social History:  reports that she has never smoked. She has never used smokeless tobacco. She reports that she does not currently use alcohol. She reports that she does not use drugs.     Maternal Diabetes: No Genetic Screening: Normal Maternal Ultrasounds/Referrals: Normal Fetal  Ultrasounds or other Referrals:  None Maternal Substance Abuse:  No Significant Maternal Medications:  Meds include: Other: Procardia XL Significant Maternal Lab Results:  GBS Neg Number of Prenatal Visits:greater than 3 verified prenatal visits Maternal Vaccinations:TDap Other Comments:  None  Review of Systems  Constitutional:  Negative for chills and fever.  Respiratory:  Negative for shortness of breath.   Gastrointestinal:  Negative for abdominal pain, nausea and vomiting.  Genitourinary:  Negative for vaginal bleeding.  Maternal Medical History:  Reason for admission: Nausea. Induction of Labor for Gestational Hypertension  Contractions: Frequency: irregular.   Perceived severity is mild.   Fetal activity: Perceived fetal activity is normal.   Last perceived fetal movement was within the past hour.   Prenatal complications: PIH.   No bleeding or placental abnormality.   Prenatal Complications - Diabetes: none.   Last menstrual period 06/24/2022. Maternal Exam:  Uterine Assessment: Contraction strength is mild.  Contraction frequency is irregular.  Abdomen: Patient reports no abdominal tenderness. Introitus: Normal vulva. Normal vagina.  Ferning test: not done.  Nitrazine test: not done. Amniotic fluid character: not assessed. Pelvis: adequate for delivery.   Cervix: Cervix evaluated by digital exam.     Fetal Exam Fetal Monitor Review: Mode: ultrasound.   Baseline rate: 140.  Variability: moderate (6-25 bpm).   Pattern: accelerations present and no decelerations.    Physical Exam Constitutional:  General: She is not in acute distress.    Appearance: She is not ill-appearing or toxic-appearing.  Cardiovascular:     Rate and Rhythm: Normal rate.  Pulmonary:     Effort: Pulmonary effort is normal.  Abdominal:     General: There is no distension.     Tenderness: There is no abdominal tenderness. There is no guarding.  Genitourinary:    General: Normal vulva.      Comments: Dilation: Closed Effacement (%): 50 Station: -3 Presentation: Vertex Exam by:: Wynelle Bourgeois, CNM  Bishop Score = 3 Musculoskeletal:        General: Normal range of motion.     Cervical back: Normal range of motion.  Skin:    General: Skin is warm and dry.  Neurological:     General: No focal deficit present.     Mental Status: She is alert.  Psychiatric:        Mood and Affect: Mood normal.    Prenatal labs: ABO, Rh: O/Positive/-- (05/28 1619) Antibody: Negative (09/12 0830) Rubella: 3.46 (05/28 1619) RPR: Non Reactive (09/12 0830)  HBsAg: Negative (05/28 1619)  HIV: Non Reactive (09/12 0830)  GBS: Negative/-- (11/11 1600)  Results for orders placed or performed during the hospital encounter of 03/25/23 (from the past 24 hour(s))  Type and screen [facility]     Status: None   Collection Time: 03/25/23  1:23 AM  Result Value Ref Range   ABO/RH(D) O POS    Antibody Screen NEG    Sample Expiration      03/28/2023,2359 Performed at [facility], [address] [place], [place] [address]   CBC     Status: Abnormal   Collection Time: 03/25/23  1:28 AM  Result Value Ref Range   WBC 12.8 (H) 4.0 - 10.5 K/uL   RBC 3.89 3.87 - 5.11 MIL/uL   Hemoglobin 11.8 (L) 12.0 - 15.0 g/dL   HCT 08.6 (L) 57.8 - 46.9 %   MCV 88.7 80.0 - 100.0 fL   MCH 30.3 26.0 - 34.0 pg   MCHC 34.2 30.0 - 36.0 g/dL   RDW 62.9 52.8 - 41.3 %   Platelets 190 150 - 400 K/uL   nRBC 0.0 0.0 - 0.2 %  Comprehensive metabolic panel     Status: Abnormal   Collection Time: 03/25/23  1:28 AM  Result Value Ref Range   Sodium 134 (L) 135 - 145 mmol/L   Potassium 3.4 (L) 3.5 - 5.1 mmol/L   Chloride 105 98 - 111 mmol/L   CO2 19 (L) 22 - 32 mmol/L   Glucose, Bld 80 70 - 99 mg/dL   BUN 9 6 - 20 mg/dL   Creatinine, Ser 2.44 0.44 - 1.00 mg/dL   Calcium 8.6 (L) 8.9 - 10.3 mg/dL   Total Protein 6.1 (L) 6.5 - 8.1 g/dL   Albumin 3.1 (L) 3.5 - 5.0 g/dL   AST 21 15 - 41 U/L    ALT 20 0 - 44 U/L   Alkaline Phosphatase 108 38 - 126 U/L   Total Bilirubin 0.3 <1.2 mg/dL   GFR, Estimated >01 >02 mL/min   Anion gap 10 5 - 15  HIV Antibody (routine testing w rflx)     Status: None   Collection Time: 03/25/23  1:28 AM  Result Value Ref Range   HIV Screen 4th Generation wRfx Non Reactive Non Reactive    Assessment/Plan: Single IUP at [redacted]w[redacted]d Gestational Hypertension GBS Negative  Admit  to Labor and Delivery Routine orders Plan Cytotec to begin with .  Cervix closed internal os, unable to insert Foley   Wynelle Bourgeois 03/25/2023, 12:22 AM

## 2023-03-25 NOTE — Progress Notes (Addendum)
Norma Aguilar is a 22 y.o. G1P0000 at [redacted]w[redacted]d by ultrasound admitted for induction of labor due to Gestational Hypertension.  Subjective: Pt comfortable in bed with family at bedside. Asking about home blood pressure medication, this was ordered.  Objective: BP 128/76   Pulse 95   Temp 98 F (36.7 C) (Oral)   Resp 16   Ht 5' (1.524 m)   Wt 96.6 kg   LMP 06/24/2022 (Approximate)   BMI 41.60 kg/m  No intake/output data recorded. No intake/output data recorded.  FHT:  FHR: 130 bpm, variability: moderate,  accelerations:  Present,  decelerations:  Absent UC:   irregular, every 1-5 minutes SVE:   Dilation: 1 Effacement (%): 70 Station: -3 Exam by:: Pincus Badder CNM  Labs: Lab Results  Component Value Date   WBC 12.8 (H) 03/25/2023   HGB 11.8 (L) 03/25/2023   HCT 34.5 (L) 03/25/2023   MCV 88.7 03/25/2023   PLT 190 03/25/2023    Assessment / Plan: Induction of labor due to gestational HTN, progressing well with cytotec and foley balloon.   Labor: Progressing normally, dilated to 1cm after 25/25 cytotec and foley balloon placed without issue. Gave PO cytotec to assist with further cervical ripening Fetal Wellbeing:  Category I Gestational HTN: procardia 30mg  daily Pain Control:  PRN IV pain meds and epidural if requested I/D:  GBS negative Anticipated MOD:  NSVD  Lorayne Bender, MD 03/25/2023, 10:10 AM

## 2023-03-25 NOTE — Progress Notes (Signed)
Norma Aguilar is a 22 y.o. G1P0000 at [redacted]w[redacted]d by ultrasound admitted for induction of labor due to Gestational Hypertension.  Subjective: Patient states she is starting to feel stronger contractions after AROM @1850 . IV pain meds were started to make her more comfortable. No other questions or concerns at this time.   Objective: BP 138/88   Pulse 93   Temp 98.4 F (36.9 C) (Oral)   Resp 16   Ht 5' (1.524 m)   Wt 96.6 kg   LMP 06/24/2022 (Approximate)   BMI 41.60 kg/m  No intake/output data recorded. No intake/output data recorded.  FHT:  FHR: 150 bpm, variability: moderate,  accelerations:  Present,  decelerations:  Absent UC:   irregular, every 3-5 minutes SVE:   Dilation: 4 Effacement (%): 70 Station: -2 Exam by:: Pincus Badder CNM  Labs: Lab Results  Component Value Date   WBC 12.8 (H) 03/25/2023   HGB 11.8 (L) 03/25/2023   HCT 34.5 (L) 03/25/2023   MCV 88.7 03/25/2023   PLT 190 03/25/2023    Assessment / Plan: Induction of labor due to gestational hypertension,  progressing well on pitocin  Labor: Progressing normally, AROM @ 1850 Fetal Wellbeing:  Category I Pain Control:  IV pain meds, epidural upon request I/D:  n/a Anticipated MOD:  NSVD  MadisonCaitlin M Sierria Bruney, Student-PA 03/25/2023, 6:56 PM

## 2023-03-26 ENCOUNTER — Encounter (HOSPITAL_COMMUNITY): Payer: Self-pay | Admitting: Obstetrics and Gynecology

## 2023-03-26 ENCOUNTER — Other Ambulatory Visit: Payer: Self-pay

## 2023-03-26 MED ORDER — DIPHENHYDRAMINE HCL 25 MG PO CAPS
25.0000 mg | ORAL_CAPSULE | Freq: Four times a day (QID) | ORAL | Status: DC | PRN
  Administered 2023-03-26: 25 mg via ORAL
  Filled 2023-03-26: qty 1

## 2023-03-26 MED ORDER — ACETAMINOPHEN 325 MG PO TABS
650.0000 mg | ORAL_TABLET | ORAL | Status: DC | PRN
  Administered 2023-03-26: 650 mg via ORAL
  Filled 2023-03-26: qty 2

## 2023-03-26 MED ORDER — ONDANSETRON HCL 4 MG/2ML IJ SOLN: 4.0000 mg | INTRAMUSCULAR | Status: DC | PRN

## 2023-03-26 MED ORDER — WITCH HAZEL-GLYCERIN EX PADS: 1.0000 | MEDICATED_PAD | CUTANEOUS | Status: DC | PRN

## 2023-03-26 MED ORDER — PRENATAL MULTIVITAMIN CH
1.0000 | ORAL_TABLET | Freq: Every day | ORAL | Status: DC
  Administered 2023-03-26 – 2023-03-27 (×2): 1 via ORAL
  Filled 2023-03-26 (×2): qty 1

## 2023-03-26 MED ORDER — FUROSEMIDE 20 MG PO TABS
20.0000 mg | ORAL_TABLET | Freq: Every day | ORAL | Status: DC
  Administered 2023-03-26 – 2023-03-27 (×2): 20 mg via ORAL
  Filled 2023-03-26 (×2): qty 1

## 2023-03-26 MED ORDER — NIFEDIPINE ER OSMOTIC RELEASE 30 MG PO TB24
30.0000 mg | ORAL_TABLET | Freq: Every day | ORAL | Status: DC
  Administered 2023-03-26 – 2023-03-27 (×2): 30 mg via ORAL
  Filled 2023-03-26 (×2): qty 1

## 2023-03-26 MED ORDER — IBUPROFEN 800 MG PO TABS: 800.0000 mg | ORAL_TABLET | Freq: Three times a day (TID) | ORAL | Status: DC

## 2023-03-26 MED ORDER — BENZOCAINE-MENTHOL 20-0.5 % EX AERO
1.0000 | INHALATION_SPRAY | CUTANEOUS | Status: DC | PRN
  Administered 2023-03-26: 1 via TOPICAL
  Filled 2023-03-26: qty 56

## 2023-03-26 MED ORDER — COCONUT OIL OIL: 1.0000 | TOPICAL_OIL | Status: DC | PRN

## 2023-03-26 MED ORDER — DIBUCAINE (PERIANAL) 1 % EX OINT: 1.0000 | TOPICAL_OINTMENT | CUTANEOUS | Status: DC | PRN

## 2023-03-26 MED ORDER — SODIUM CHLORIDE 0.9% FLUSH: 3.0000 mL | INTRAVENOUS | Status: DC | PRN

## 2023-03-26 MED ORDER — SODIUM CHLORIDE 0.9% FLUSH: 10.0000 mL | Freq: Two times a day (BID) | INTRAVENOUS | Status: DC

## 2023-03-26 MED ORDER — ONDANSETRON HCL 4 MG PO TABS: 4.0000 mg | ORAL_TABLET | ORAL | Status: DC | PRN

## 2023-03-26 MED ORDER — SIMETHICONE 80 MG PO CHEW
80.0000 mg | CHEWABLE_TABLET | ORAL | Status: DC | PRN
Start: 2023-03-26 — End: 2023-03-27

## 2023-03-26 MED ORDER — IBUPROFEN 800 MG PO TABS
800.0000 mg | ORAL_TABLET | Freq: Three times a day (TID) | ORAL | Status: DC
  Administered 2023-03-26 – 2023-03-27 (×5): 800 mg via ORAL
  Filled 2023-03-26 (×6): qty 1

## 2023-03-26 MED ORDER — SODIUM CHLORIDE 0.9% FLUSH: 3.0000 mL | Freq: Two times a day (BID) | INTRAVENOUS | Status: DC

## 2023-03-26 MED ORDER — SENNOSIDES-DOCUSATE SODIUM 8.6-50 MG PO TABS
2.0000 | ORAL_TABLET | ORAL | Status: DC
  Administered 2023-03-26 – 2023-03-27 (×2): 2 via ORAL
  Filled 2023-03-26 (×3): qty 2

## 2023-03-26 NOTE — Progress Notes (Signed)
POSTPARTUM PROGRESS NOTE  Post Partum Day 1  Subjective:  [place] Volpe is a 22 y.o. G1P1001 s/p SVD at [redacted]w[redacted]d.  She reports she is doing well. No acute events overnight. She denies any problems with ambulating, voiding or po intake. Denies nausea or vomiting.  Pain is well controlled.  Lochia is appropriate.  Objective: Blood pressure 112/80, pulse 63, temperature 98 F (36.7 C), temperature source Oral, resp. rate 18, height 5' (1.524 m), weight 96.6 kg, last menstrual period 06/24/2022, SpO2 98%, unknown if currently breastfeeding.  Physical Exam:  General: alert, cooperative and no distress Chest: no respiratory distress Heart:regular rate, distal pulses intact Abdomen: soft, nontender,  Uterine Fundus: firm, appropriately tender DVT Evaluation: No calf swelling or tenderness Extremities: trace edema Skin: warm, dry  Recent Labs    03/25/23 0128 03/25/23 1844  HGB 11.8* 12.4  HCT 34.5* 37.7    Assessment/Plan: [place] Ryker is a 22 y.o. G1P1001 s/p SVD at [redacted]w[redacted]d   PPD#1 - Doing well  Routine postpartum care  Contraception: declines Feeding: breast Dispo: Plan for discharge tomorrow .   LOS: 1 day   Derrel Nip, MD Attending Family Medicine Physician, [facility], [facility]   03/26/2023, 10:44 AM

## 2023-03-26 NOTE — Lactation Note (Signed)
This note was copied from a baby's chart. Lactation Consultation Note  Patient Name: Norma Aguilar Today's Date: 03/26/2023 Age:22 hours  Attempted to see mom but everyone sleeping.   Maternal Data    Feeding    LATCH Score Latch: Grasps breast easily, tongue down, lips flanged, rhythmical sucking.  Audible Swallowing: Spontaneous and intermittent  Type of Nipple: Everted at rest and after stimulation  Comfort (Breast/Nipple): Soft / non-tender  Hold (Positioning): Full assist, staff holds infant at breast  LATCH Score: 8   Lactation Tools Discussed/Used    Interventions Interventions: Breast feeding basics reviewed;Assisted with latch;Skin to skin;Adjust position;Support pillows  Discharge    Consult Status      Charyl Dancer 03/26/2023, 3:52 AM

## 2023-03-26 NOTE — Lactation Note (Signed)
This note was copied from a baby's chart. Lactation Consultation Note  Patient Name: Norma Aguilar Today's Date: 03/26/2023 Age:23 hours  Reason for consult: Initial assessment;Early term 37-38.6wks;Breastfeeding assistance;Primapara;1st time breastfeeding  P1, [redacted]w[redacted]d, ETI  Initial LC visit to see P1 mother of ETI. Upon arrival to room, mother had baby Norma "Norma Aguilar" latched to her breast. Mother states baby has been latching well and she reports she has been leaking colostrum for several weeks.   Discussed early term infant feeding behavior and explained that she could show fatigue at the breast. Currently, baby is latched and breastfeeding well. Mother was very receptive and desires to also pump. Set up the DEBP, instructed how to use the colostrum collectors and to pump every 3 hours in the initiation setting to stimulate milk production. Instructed mother on the use, frequency, cleaning of breast pump and storage of breast milk. Mother fitted with a 18 mm flange although she probably is a 16 mm (not available here).  After a few minutes of pumping, baby was eagerly rooting and placed back to breast. Basic breastfeeding education in football hold. Baby latched with wide gape, and intermittently "chomping" instead of sucking. Mother taught how to do breast compression and baby suckled  nutritive.    Maternal Data Has patient been taught Hand Expression?: Yes Does the patient have breastfeeding experience prior to this delivery?: No  Feeding Mother's Current Feeding Choice: Breast Milk  LATCH Score Latch: Grasps breast easily, tongue down, lips flanged, rhythmical sucking.  Audible Swallowing: A few with stimulation  Type of Nipple: Everted at rest and after stimulation  Comfort (Breast/Nipple): Soft / non-tender  Hold (Positioning): Assistance needed to correctly position infant at breast and maintain latch.  LATCH Score: 8   Lactation Tools Discussed/Used Tools:  Pump;Flanges Flange Size: 18 Breast pump type: Double-Electric Breast Pump;Manual Pump Education: Setup, frequency, and cleaning;Milk Storage Reason for Pumping: early term infant, stimulate milk production Pumping frequency: every 3 hours for 15 min in the intiation setting  Interventions Interventions: Breast feeding basics reviewed;Assisted with latch;Breast compression;Adjust position;Support pillows;Position options;Hand pump;DEBP;Education;LC Services brochure  Discharge Pump: DEBP;Personal (L[place]inoh)  Consult Status Consult Status: Follow-up Date: 03/27/23 Follow-up type: In-patient    Christella Hartigan M 03/26/2023, 6:59 PM

## 2023-03-26 NOTE — Progress Notes (Signed)
CSW received consult for hx of Anxiety, Depression, ODD and BIpolar.  CSW met with MOB to offer support and complete assessment. CSW entered the room, introduced herself and acknowledged that FOB and family was present. MOB gave CSW verbal permission to speak about anything while her family was present. CSW observed the infant being held by family while MOB was in and out of sleep in the bed. MOB presented as calm, was agreeable to consult and remained engaged throughout encounter.  CSW inquired about MOB's mental health history. MOB denied being diagnosed with Bipolar. MOB reported being diagnosed with MDD in 2014, anxiety since the age of 89 and ADHD in 2007. MOB reported participating in therapy in the past however denied being prescribed medication for support. MOB reported coping skills that included breathing techniques, surrounding herself with positive people and talking with family and friends. MOB reported her supports as FOB and her family. CSW provided education regarding the baby blues period vs. perinatal mood disorders, discussed treatment and gave resources for mental health follow up if concerns arise.  CSW recommends self-evaluation during the postpartum time period using the New Mom Checklist from Postpartum Progress and encouraged MOB to contact a medical professional if symptoms are noted at any time.      CSW asked MOB has she selected a pediatrician for the infant's follow up visits; MOB said [facility].  MOB reported having all essential items for the infant including a carseat, bassinet and crib for safe sleeping. CSW provided review of Sudden Infant Death Syndrome (SIDS) precautions.     CSW identifies no further need for intervention and no barriers to discharge at this time.   Enos Fling, Theresia Majors Clinical Social Worker [phone]

## 2023-03-26 NOTE — Discharge Summary (Signed)
Postpartum Discharge Summary  Date of Service updated***     Patient Name: Norma Aguilar DOB: 23-May-2000 MRN: [id number]  Date of admission: 03/25/2023 Delivery date:03/25/2023 Delivering provider: Sundra Aland Date of discharge: 03/26/2023  Admitting diagnosis: Gestational hypertension [O13.9] Intrauterine pregnancy: [redacted]w[redacted]d     Secondary diagnosis:  Principal Problem:   SVD (spontaneous vaginal delivery) Active Problems:   Generalized anxiety disorder   Gestational hypertension  Additional problems: none    Discharge diagnosis: Term Pregnancy Delivered and Gestational Hypertension                                              Post partum procedures: none Augmentation: AROM, Pitocin, Cytotec, and IP Foley Complications: None  Hospital course: Induction of Labor With Vaginal Delivery   22 y.o. yo G1P1001 at [redacted]w[redacted]d was admitted to the hospital 03/25/2023 for induction of labor.  Indication for induction: Gestational hypertension.  Patient had an uncomplicated labor course. Membrane Rupture Time/Date: 6:50 PM,03/25/2023  Delivery Method:Vaginal, Spontaneous Operative Delivery:N/A Episiotomy: None Lacerations:  2nd degree;Perineal Details of delivery can be found in separate delivery note.  Patient had a postpartum course remarkable for taking Lasix 20mg  x 7d and Procardia XL 30mg  for meeting BP criteria. Patient is discharged home 03/26/23.  Newborn Data: Birth date:03/25/2023 Birth time:11:19 PM Gender:Female Living status:Living Apgars:9 ,9  BMWUXL:2440 g (6lb 5oz)  Magnesium Sulfate received: No BMZ received: No Rhophylac:N/A MMR:No T-DaP:Given prenatally Flu: No RSV Vaccine received: No Transfusion:No  Immunizations received: Immunization History  Administered Date(s) Administered   Tdap 01/16/2023    Physical exam  Vitals:   03/26/23 0220 03/26/23 0634 03/26/23 0946 03/26/23 1324  BP: 122/80 117/75 112/80 117/67  Pulse: (!) 101 (!) 109 63 89   Resp: 17 18 18 17   Temp: 97.9 F (36.6 C)  98 F (36.7 C) 98 F (36.7 C)  TempSrc: Oral  Oral Oral  SpO2: 98% 99% 98% 99%  Weight:      Height:       General: alert and cooperative Lochia: appropriate Uterine Fundus: firm Incision: N/A DVT Evaluation: No evidence of DVT seen on physical exam. Labs: Lab Results  Component Value Date   WBC 13.5 (H) 03/25/2023   HGB 12.4 03/25/2023   HCT 37.7 03/25/2023   MCV 90.4 03/25/2023   PLT 195 03/25/2023      Latest Ref Rng & Units 03/25/2023    1:28 AM  CMP  Glucose 70 - 99 mg/dL 80   BUN 6 - 20 mg/dL 9   Creatinine 1.02 - 7.25 mg/dL 3.66   Sodium 440 - 347 mmol/L 134   Potassium 3.5 - 5.1 mmol/L 3.4   Chloride 98 - 111 mmol/L 105   CO2 22 - 32 mmol/L 19   Calcium 8.9 - 10.3 mg/dL 8.6   Total Protein 6.5 - 8.1 g/dL 6.1   Total Bilirubin <4.2 mg/dL 0.3   Alkaline Phos 38 - 126 U/L 108   AST 15 - 41 U/L 21   ALT 0 - 44 U/L 20    Edinburgh Score:    03/26/2023   11:39 AM  Edinburgh Postnatal Depression Scale Screening Tool  I have been able to laugh and see the funny side of things. 0  I have looked forward with enjoyment to things. 0  I have blamed myself unnecessarily when things went wrong. 0  I have been anxious or worried for no good reason. 0  I have felt scared or panicky for no good reason. 0  Things have been getting on top of me. 0  I have been so unhappy that I have had difficulty sleeping. 0  I have felt sad or miserable. 0  I have been so unhappy that I have been crying. 0  The thought of harming myself has occurred to me. 0  Edinburgh Postnatal Depression Scale Total 0   Edinburgh Postnatal Depression Scale Total: 0   After visit meds:  Allergies as of 03/26/2023       Reactions   Phenergan [promethazine Hcl]    Prozac [fluoxetine]    Reglan [metoclopramide]    Latex Itching, Rash   Lexapro [escitalopram Oxalate] Rash     Med Rec must be completed prior to using this  SMARTLINK***        Discharge home in stable condition Infant Feeding: {Baby feeding:23562} Infant Disposition:{CHL IP OB HOME WITH NWGNFA:21308} Discharge instruction: per After Visit Summary and Postpartum booklet. Activity: Advance as tolerated. Pelvic rest for 6 weeks.  Diet: routine diet Future Appointments: Future Appointments  Date Time Provider Department Center  04/02/2023 10:50 AM [facility] NURSE [facility]  04/28/2023  9:30 AM Cresenzo-Dishmon, Scarlette Calico, CNM [facility]   Follow up Visit: Message sent 11/120  Please schedule this patient for a In person postpartum visit in 4 weeks with the following provider: Any provider. Additional Postpartum F/U:BP check 1 week  High risk pregnancy complicated by: HTN Delivery mode:  Vaginal, Spontaneous Anticipated Birth Control:   withdrawal and provera   03/26/2023 Arabella Merles, CNM

## 2023-03-27 ENCOUNTER — Ambulatory Visit (HOSPITAL_COMMUNITY): Payer: Self-pay

## 2023-03-27 ENCOUNTER — Other Ambulatory Visit (HOSPITAL_COMMUNITY): Payer: Self-pay

## 2023-03-27 MED ORDER — NIFEDIPINE ER 30 MG PO TB24
30.0000 mg | ORAL_TABLET | Freq: Every day | ORAL | 1 refills | Status: AC
  Filled 2023-03-27: qty 30, 30d supply, fill #0

## 2023-03-27 MED ORDER — IBUPROFEN 800 MG PO TABS
800.0000 mg | ORAL_TABLET | Freq: Three times a day (TID) | ORAL | 0 refills | Status: AC | PRN
  Filled 2023-03-27: qty 30, 10d supply, fill #0

## 2023-03-27 MED ORDER — FUROSEMIDE 20 MG PO TABS
20.0000 mg | ORAL_TABLET | Freq: Every day | ORAL | 0 refills | Status: DC
  Filled 2023-03-27: qty 5, 5d supply, fill #0

## 2023-03-27 NOTE — Lactation Note (Signed)
This note was copied from a baby's chart. Lactation Consultation Note  Patient Name: Norma Aguilar Today's Date: 03/27/2023 Age:23 hours 2nd LC visit  Reason for consult: Follow-up assessment;Primapara;1st time breastfeeding;Infant weight loss;Early term 37-38.6wks Due to weight loss donor milk was started today and per mom baby recently took 15 ml and has BF x 3 today since this am.  LC recommended to feed the baby 1st at the breast, supplement with the donor milk and then post pump. If she is really tired to work on naps.  Parents are aware of storage of breast milk.  Mom aware LC will F/U in the am and to call is needed.  Maternal Data Has patient been taught Hand Expression?: Yes  Feeding Mother's Current Feeding Choice: Breast Milk and Donor Milk  LATCH Score ( Latch score from the nurse )  Latch: Grasps breast easily, tongue down, lips flanged, rhythmical sucking.  Audible Swallowing: A few with stimulation  Type of Nipple: Everted at rest and after stimulation  Comfort (Breast/Nipple): Soft / non-tender  Hold (Positioning): Assistance needed to correctly position infant at breast and maintain latch.  LATCH Score: 8   Lactation Tools Discussed/Used Tools: Pump;Flanges Flange Size: 18;21 Breast pump type: Double-Electric Breast Pump;Manual Pump Education: Milk Storage;Setup, frequency, and cleaning;Other (comment) Pumping frequency: mom aware and plans to add pumping with the DEBP after feedings . Save the milk for the next feeding and feed back to the baby  Interventions Interventions: Breast feeding basics reviewed;Hand pump;DEBP;Education;LC Services brochure;CDC Guidelines for Breast Pump Cleaning  Discharge Pump: Personal;DEBP;Manual  Consult Status Consult Status: Follow-up Date: 03/28/23 Follow-up type: In-patient    Norma Aguilar 03/27/2023, 6:04 PM

## 2023-03-27 NOTE — Anesthesia Postprocedure Evaluation (Signed)
Anesthesia Post Note  Patient: Norma Aguilar  Procedure(s) Performed: AN AD HOC LABOR EPIDURAL     Patient location during evaluation: Other Anesthesia Type: Epidural Level of consciousness: awake and alert and oriented Pain management: satisfactory to patient Vital Signs Assessment: post-procedure vital signs reviewed and stable Respiratory status: respiratory function stable Cardiovascular status: stable Postop Assessment: no headache, no backache, epidural receding, patient able to bend at knees, no signs of nausea or vomiting, adequate PO intake and able to ambulate Anesthetic complications: no   No notable events documented.  Last Vitals:  Vitals:   03/26/23 1950 03/27/23 0546  BP: 119/68 106/78  Pulse: 100 88  Resp: 18 18  Temp: 37.2 C 36.7 C  SpO2: 99% 99%    Last Pain:  Vitals:   03/27/23 0807  TempSrc:   PainSc: 0-No pain   Pain Goal:                   Norma Aguilar

## 2023-03-27 NOTE — Progress Notes (Signed)
Upon Nurse rounding, nurse walking in patient room patient was tearful, nurse asked what was wrong, patient stated "nothing", nurse asked if she needed anything she stated "tylenol"  nurse leaves room to  retrieve meds upon entering backing in room nurse gave med and asked if patient needed to talk and patient stated "no". Nurse stated to patient to let her know if she needed anything then left the room.   Marc Morgans RN

## 2023-03-27 NOTE — Lactation Note (Signed)
This note was copied from a baby's chart. Lactation Consultation Note  Patient Name: Norma Aguilar Today's Date: 03/27/2023 Age:23 hours, P1  Reason for consult: Follow-up assessment;Primapara;1st time breastfeeding;Early term 37-38.6wks;Infant weight loss (8 % weight loss,), Less than 6 pounds with weight loss, Skin Bili - at 30 hours 7.7  Parents aware they are on the list for D/C.  Baby Latched when [facility] entered the room with depth and swallows.  LC reviewed BF D/C teaching and the importance of prevention of engorgement and LC resources.  Parents receptive to teaching and BF review.   LC mentioned to parents the Peds or NP may decide to reweigh prior to D/C.    Maternal Data Has patient been taught Hand Expression?: Yes (mom able to hand express well.) LC noted areola edema and instructed mom how to do the reverse pressure exercise to elongate the nipple areola.  Mom aware when the milk comes in and the areola fuller to pre - pump and reverse pressure prior to latch.   Feeding Mother's Current Feeding Choice: Breast Milk  LATCH Score Latch:  (latched with depth -)  Audible Swallowing:  (swallows noted)  Type of Nipple:  (nipple well rounded when baby released)  Comfort (Breast/Nipple):  (per mom comfortable with the latch)  Hold (Positioning):  (mom had latched the baby)      Lactation Tools Discussed/Used Tools: Pump;Flanges Flange Size: 18;21;Other (comment) (mom aware when her milk comes in to increasde her flange to #21 if needed and when not has full decrease to #18 F) Breast pump type: Manual;Double-Electric Breast Pump Pump Education: Milk Storage;Other (comment) (DEBP and HP was already started with previous LC, per mom no results as now. It's easier to hand express.) Pumped volume: 0 mL  Interventions Interventions: Breast feeding basics reviewed;Hand pump;DEBP;Education;LC Services brochure;CDC Guidelines for Breast Pump Cleaning  Discharge Discharge  Education: Engorgement and breast care Pump: Personal;DEBP;Manual (per mom a DEBP [place]oh)  Consult Status Consult Status: Complete Date: 03/27/23    Kathrin Greathouse 03/27/2023, 8:58 AM

## 2023-03-28 ENCOUNTER — Ambulatory Visit (HOSPITAL_COMMUNITY): Payer: Self-pay

## 2023-03-28 MED ORDER — FENTANYL-BUPIVACAINE-NACL 0.5-0.125-0.9 MG/250ML-% EP SOLN
EPIDURAL | Status: DC | PRN
  Administered 2023-03-25 – 2023-03-26 (×2): 12 mL/h via EPIDURAL

## 2023-03-28 MED ORDER — LIDOCAINE HCL (PF) 1 % IJ SOLN
INTRAMUSCULAR | Status: DC | PRN
  Administered 2023-03-26: 8 mL via EPIDURAL
  Administered 2023-03-26: 2 mL via EPIDURAL

## 2023-03-28 NOTE — Anesthesia Procedure Notes (Addendum)

## 2023-03-28 NOTE — Lactation Note (Signed)
This note was copied from a baby's chart. Lactation Consultation Note  Patient Name: Norma Aguilar Today's Date: 03/28/2023 Age:23 hours, P1 , Weight loss 21 gm from yesterday  Reason for consult: Follow-up assessment;Primapara;1st time breastfeeding;Early term 37-38.6wks;Infant weight loss As LC entered the room, mom just getting started to latch.  LC offered to assist and reviewed the importance to latch with depth to enhance milk flow. Increased swallows noted with breast compressions. Baby fed 10 mins and released. Nipple well rounded.  LC stressed the importance of firm support and baby alignment when latched.  LC showed parents how to pace feed the baby and she tolerated the standard nipple well. Increasing volume to 30 ml  Due to weight loss and increased Bili check, Serum Bili ordered by nurse and reweigh at 1400.  Dr. Margo Aye into exam baby. Mom had asked to check for a tongue tie and LC had Dr. Margo Aye do it with exam.  With latch baby noted to extend her tongue well over her gumline and upper lip stretches well to a flanged position.  Per mom mentioned she has only slept 1 1/2 hours since yesterday.  LC encouraged rest while the baby is sleeping.  LC plan for now until weight loss is decreased to 7 % ,  Feed with cues and by 3 hours  Offer breast 1st and then supplement with 30 ml of donor milk standard nipple.  Post pump both breast 15 mins , save milk for the next feeding.    Maternal Data Has patient been taught Hand Expression?: Yes Does the patient have breastfeeding experience prior to this delivery?: No  Feeding Mother's Current Feeding Choice: Breast Milk and Donor Milk Nipple Type: Nfant Standard Flow (white)  LATCH Score Latch: Grasps breast easily, tongue down, lips flanged, rhythmical sucking.  Audible Swallowing: Spontaneous and intermittent  Type of Nipple: Everted at rest and after stimulation  Comfort (Breast/Nipple): Soft / non-tender  Hold  (Positioning): Assistance needed to correctly position infant at breast and maintain latch.  LATCH Score: 9   Lactation Tools Discussed/Used    Interventions Interventions: Education;Pace feeding  Discharge Pump: Personal;Manual;DEBP  Consult Status Consult Status: Follow-up Date: 03/28/23 Follow-up type: In-patient    Matilde Sprang Jaire Pinkham 03/28/2023, 10:56 AM

## 2023-03-29 ENCOUNTER — Ambulatory Visit (HOSPITAL_COMMUNITY): Payer: Self-pay

## 2023-03-29 NOTE — Lactation Note (Signed)
This note was copied from a baby's chart. Lactation Consultation Note  Patient Name: Norma Aguilar [id number] Date: 03/29/2023 Age:23 days Reason for consult: Follow-up assessment;1st time breastfeeding;Early term 37-38.6wks  P1, 37 wks, 84 hours of age.- weight loss 9%. Parents very attentive. Breasting, donor milk, and pumping. Discussed feeding/diapering, and milk transitioning expectations. Parents are stressed by infant weight loss, frequent feeding and some spitting afterwards by baby. Discussed at home breast first, pump afterwards, and feed pumped milk as well. Infant stomach size expectations 20-30- ml per feed- increasing daily -late pre-term status challenges highlighted. Demonstrated putting infant to breast and keeping infant working actively at breast- 10 minutes in LC presence. Pumped with mom - demonstrated pump use- after one "let-down" -mom easily produced about 10 ML, encouraged mom her milk is coming in/transitioning. She agrees she's feeling a filing discomfort in her breast. Discussed what engorgement would feel like at home, prevention, and treatment with ice packs needed. Mom has hands-free pump at home, encouraged mom to be sure it empties her breast, may need additional emptying of breast. Highlighted burping techniques for baby. Discharge anticipated, encouraged to call as needed. O/P LC services highlighted for issues once DC.    Maternal Data Has patient been taught Hand Expression?: Yes Does the patient have breastfeeding experience prior to this delivery?: No  Feeding Mother's Current Feeding Choice: Breast Milk and Donor Milk  LATCH Score Latch: Grasps breast easily, tongue down, lips flanged, rhythmical sucking.  Audible Swallowing: Spontaneous and intermittent  Type of Nipple: Everted at rest and after stimulation  Comfort (Breast/Nipple): Soft / non-tender  Hold (Positioning): Assistance needed to correctly position infant at breast and maintain latch.  (Discussed infant learning breast, needs more encouragement to stay actively latched then with bottle)  LATCH Score: 9   Lactation Tools Discussed/Used Tools: Pump;Flanges;Coconut oil Flange Size: 18 Breast pump type: Double-Electric Breast Pump Pump Education: Setup, frequency, and cleaning;Milk Storage Reason for Pumping: Weightloss, 37 wks, 6-5 birth weight Pumping frequency: Put baby to breast first, pump after or pump if donor milk used Pumped volume: 10 mL  Interventions Interventions: Breast feeding basics reviewed;Assisted with latch;Skin to skin;Hand express;Pre-pump if needed;Breast compression;Expressed milk;Coconut oil;DEBP;Education  Discharge Discharge Education: Engorgement and breast care Pump: Hands Free;Personal  Consult Status Consult Status: Complete Date: 03/29/23    Idamae Lusher 03/29/2023, 11:36 AM

## 2023-04-01 ENCOUNTER — Ambulatory Visit (HOSPITAL_COMMUNITY): Payer: Self-pay

## 2023-04-01 NOTE — Lactation Note (Signed)
This note was copied from a baby's chart. Lactation Consultation Note  Patient Name: Norma Aguilar RJJOA'C Date: 04/01/2023 Age:23 days Reason for consult: Other (Comment);Follow-up assessment;Primapara;1st time breastfeeding;Early term 37-38.6wks;Infant weight loss;Hyperbilirubinemia (Peds readmit, gHTN)  Visited with family of 29 days old ETI female; Norma Aguilar is a P1 and reports she's been putting baby "Norma Aguilar" to breast and pumping the other side and offering her EBM after feedings at the breast. Baby got readmitted to the Peds unit due to weight loss (still below birth weight) and hyperbilirubinemia. Bilirubin is currently trending down but baby still working on feedings. Ms. Aguilar voiced that baby prefers the R side over the L one but her L side is the better "producer" it started leaking during [facility] consult and she asked if LC could observe a feeding at the breast. This LC took baby Norma Aguilar to the L side in football hold and it took her a few attempts to latch, but once she did, she got into a rhythmical sucking pattern with swallows identified (see LATCH score). However she fatigued easily and fell asleep after 5 minutes, parents voiced she's been taking bottles with 30-40 ml p/feeding and were concerned about her preferring the bottle more than the breast, especially her L side. Nipples are short shafted but breast tissue is highly compressible, let family know we could try a NS # 16 if there is a concerns regarding "nipple confusion". Parents asked this LC to come back at 1:30 pm to try the NS # 16 but the attendant stopped this LC prior going into the room because Ms. Aguilar was teary and having a hard time processing all these interventions, parents still requested the NS # 16 and MD brought it in the room, they are aware of LC services and will call out again for assistance if needed.  Feeding Mother's Current Feeding Choice: Breast Milk and Formula  LATCH Score Latch: Repeated  attempts needed to sustain latch, nipple held in mouth throughout feeding, stimulation needed to elicit sucking reflex.  Audible Swallowing: Spontaneous and intermittent  Type of Nipple: Everted at rest and after stimulation (short shafted)  Comfort (Breast/Nipple): Soft / non-tender  Hold (Positioning): Assistance needed to correctly position infant at breast and maintain latch.  LATCH Score: 8  Lactation Tools Discussed/Used Tools: Pump;Flanges;Coconut oil Flange Size: 18 Breast pump type: Double-Electric Breast Pump Pump Education: Setup, frequency, and cleaning;Milk Storage Reason for Pumping: Peds readmit, below birth weight at 7 days post-partum, hyperbilirubinemia Pumping frequency: 10 times/24 hours, recommneded pumping after every other feeding due to maternal exhaustion Pumped volume: 40 mL  Interventions Interventions: Breast feeding basics reviewed;Assisted with latch;Skin to skin;Breast compression;Adjust position;Support pillows;Coconut oil;DEBP;Education  Plan of care Encouraged to continue taking baby to breast but at every other feed She'll continue supplementing after feedings/attempts at the breast Parents will continue advancing on bottle feedings, they are aware she still needs a bottle when not going to breast She'll pump whenever baby is getting a bottle  FOB present and supportive. All questions and concerns answered, family to contact [facility] services PRN.  Discharge Pump: Hands Free ([place] DEBP at home)  Consult Status Consult Status: PRN Date: 04/02/23 Follow-up type: In-patient   [name] 04/01/2023, 1:51 PM

## 2023-04-02 ENCOUNTER — Ambulatory Visit: Payer: 59 | Admitting: *Deleted

## 2023-04-02 ENCOUNTER — Ambulatory Visit: Payer: 59

## 2023-04-02 ENCOUNTER — Encounter: Payer: Self-pay | Admitting: *Deleted

## 2023-04-02 VITALS — BP 122/83 | HR 93 | Ht 60.0 in | Wt 197.5 lb

## 2023-04-02 DIAGNOSIS — R300 Dysuria: Secondary | ICD-10-CM

## 2023-04-02 LAB — POCT URINALYSIS DIPSTICK
Glucose, UA: NEGATIVE
Ketones, UA: NEGATIVE
Nitrite, UA: NEGATIVE
Protein, UA: NEGATIVE

## 2023-04-02 MED ORDER — FLUCONAZOLE 150 MG PO TABS: 150.0000 mg | ORAL_TABLET | Freq: Once | ORAL | 1 refills | Status: AC

## 2023-04-02 MED ORDER — AMOXICILLIN-POT CLAVULANATE 500-125 MG PO TABS: 1.0000 | ORAL_TABLET | Freq: Two times a day (BID) | ORAL | 0 refills | Status: AC

## 2023-04-02 NOTE — Progress Notes (Signed)
   NURSE VISIT- BLOOD PRESSURE CHECK  SUBJECTIVE:  Norma Aguilar is a 22 y.o. G39P1001 female here for BP check. She is postpartum, delivery date 03/25/23.   Pt has had burning with urination X 2 days. Urine dip showed 3+ blood and 3+ leuks. Urine sent for culture. If culture is negative, can stop antibiotic.   HYPERTENSION ROS:  Pregnant/postpartum:  Severe headaches that don't go away with tylenol/other medicines: No  Visual changes (seeing spots/double/blurred vision) No  Severe pain under right breast breast or in center of upper chest No  Severe nausea/vomiting No  Taking medicines as instructed yes    OBJECTIVE:  BP 122/83 (BP Location: Right Arm, Patient Position: Sitting, Cuff Size: Normal)   Pulse 93   Ht 5' (1.524 m)   Wt 197 lb 8 oz (89.6 kg)   LMP 06/24/2022 (Approximate)   Breastfeeding Yes   BMI 38.57 kg/m pt's 1st BP was 132/90 pulse 97. Appearance alert, well appearing, and in no distress.  ASSESSMENT: Postpartum  blood pressure check  PLAN: Discussed with  Albertine Grates, NP    Recommendations: stop medicine 2 days before next visit; will send in antibiotic for possible UTI and med for yeast.  Pt has passed clots. Discussed bleeding precautions. Follow up as scheduled    Malachy Mood  04/02/2023 4:22 PM

## 2023-04-04 LAB — URINE CULTURE

## 2023-04-21 ENCOUNTER — Encounter: Payer: Self-pay | Admitting: Advanced Practice Midwife

## 2023-04-28 ENCOUNTER — Encounter: Payer: Self-pay | Admitting: Advanced Practice Midwife

## 2023-04-28 ENCOUNTER — Other Ambulatory Visit (HOSPITAL_COMMUNITY)
Admission: RE | Admit: 2023-04-28 | Discharge: 2023-04-28 | Disposition: A | Discharge status: Home / Self Care | Payer: 59 | Source: Ambulatory Visit | Attending: Advanced Practice Midwife | Admitting: Advanced Practice Midwife

## 2023-04-28 ENCOUNTER — Ambulatory Visit: Payer: 59 | Admitting: Advanced Practice Midwife

## 2023-04-28 DIAGNOSIS — N898 Other specified noninflammatory disorders of vagina: Secondary | ICD-10-CM

## 2023-04-28 MED ORDER — METOCLOPRAMIDE HCL 10 MG PO TABS: 10.0000 mg | ORAL_TABLET | Freq: Three times a day (TID) | ORAL | 3 refills | Status: AC

## 2023-04-28 NOTE — Patient Instructions (Signed)
Supplemental nursing system.

## 2023-04-28 NOTE — Progress Notes (Signed)
Post Partum Visit Note   Chief Complaint:   Postpartum Care  History of Present Illness:   Norma Aguilar is a 22 y.o. G49P1001 Caucasian female being seen today for a postpartum visit. She is 6 weeks postpartum following a spontaneous vaginal delivery at 37.3 gestational weeks. IOL: Yes, for Gestational hypertension. Anesthesia: epidural.  Laceration: 2nd degree.  Complications: none. Inpatient contraception: no.   Pregnancy complicated by [facility] . Tobacco use: no. Substance use disorder: no. Last pap smear:     Component Value Date/Time   DIAGPAP  07/18/2022 1007    - Negative for intraepithelial lesion or malignancy (NILM)   ADEQPAP  07/18/2022 1007    Satisfactory for evaluation; transformation zone component PRESENT.     Next pap smear due: 2027 Patient's last menstrual period was 06/24/2022 (approximate).  Postpartum course has been complicated by continued GHTN, on procardia. Last dose  .  Last dose 1 weeks  Bleeding no bleeding. Bowel function is normal. Bladder function is normal. Urinary incontinence? No, fecal incontinence? No Patient is sexually active. Last sexual activity: yesterday.    Upstream - 04/28/23 0943       Pregnancy Intention Screening   Does the patient want to become pregnant in the next year? No    Does the patient's partner want to become pregnant in the next year? No    Would the patient like to discuss contraceptive options today? No      Contraception Wrap Up   Current Method No Contraceptive Precautions    End Method No Contraception Precautions    Contraception Counseling Provided No            The pregnancy intention screening data noted above was reviewed. Potential methods of contraception were discussed. The patient elected to proceed with No Contraception Precautions.  Edinburgh Postpartum Depression Screening: Negative  Edinburgh Postnatal Depression Scale - 04/28/23 0940       Edinburgh Postnatal Depression Scale:  In the Past 7  Days   I have been able to laugh and see the funny side of things. 0    I have looked forward with enjoyment to things. 0    I have blamed myself unnecessarily when things went wrong. 0    I have been anxious or worried for no good reason. 1    I have felt scared or panicky for no good reason. 0    Things have been getting on top of me. 0    I have been so unhappy that I have had difficulty sleeping. 0    I have felt sad or miserable. 0    I have been so unhappy that I have been crying. 0    The thought of harming myself has occurred to me. 0    Edinburgh Postnatal Depression Scale Total 1            Baby's course has been uncomplicated. Baby is feeding by breast and bottle: milk supply inadequate . Infant has a pediatrician/family doctor? Yes.  Childcare strategy if returning to work/school: yes.  Pt has material needs met for her and baby: Yes.   Review of Systems:   Pertinent items are noted in HPI Denies Abnormal vaginal discharge w/ itching/odor/irritation, headaches, visual changes, shortness of breath, chest pain, abdominal pain, severe nausea/vomiting, or problems with urination or bowel movements. Pertinent History Reviewed:  Reviewed past medical,surgical, obstetrical and family history.  Reviewed problem list, medications and allergies. OB History  Gravida Para Term Preterm AB Living  1 1 1  0 0 1  SAB IAB Ectopic Multiple Live Births  0 0 0 0 1    # Outcome Date GA Lbr Len/2nd Weight Sex Type Anes PTL Lv  1 Term 03/25/23 [redacted]w[redacted]d 34:52 6 lb 5 oz (2.863 kg) F Vag-Spont EPI  LIV     Birth Comments: WNL   Physical Assessment:   Vitals:   04/28/23 0934  BP: 125/88  Pulse: 79  Weight: 192 lb (87.1 kg)  Height: 5\' 1"  (1.549 m)  Body mass index is 36.28 kg/m.  Objective:  Blood pressure 125/88, pulse 79, height 5\' 1"  (1.549 m), weight 192 lb (87.1 kg), last menstrual period 06/24/2022, currently breastfeeding.  General:  alert, cooperative, and no distress    Breasts:  negative  Lungs: Normal respiratory effort  Heart:  regular rate and rhythm  Abdomen: soft, non-tender,    Vulva:  normal  Vagina: normal vagina  Cervix:  normal  Corpus: Well involuted  Adnexa:  not evaluated  Rectal Exam: no hemorrhoids          No results found for this or any previous visit (from the past 24 hours).  Assessment & Plan:  1) Postpartum exam 2) 6 wks s/p spontaneous vaginal delivery 3) breast & bottle feeding 4) Depression screening 5) Contraception counseling  Essential components of care per ACOG recommendations:  1.  Mood and well being:  If positive depression screen, discussed and plan developed.  If using tobacco we discussed reduction/cessation and risk of relapse If current substance abuse, we discussed and referral to local resources was offered.   2. Infant care and feeding:  If breastfeeding, discussed returning to work, pumping, breastfeeding-associated pain, guidance regarding return to fertility while lactating if not using another method. If needed, patient was provided with a letter to be allowed to pump q 2-3hrs to support lactation in a private location with access to a refrigerator to store breastmilk.   Recommended that all caregivers be immunized for flu, pertussis and other preventable communicable diseases If pt does not have material needs met for her/baby, referred to local resources for help obtaining these.  3. Sexuality, contraception and birth spacing Provided guidance regarding sexuality, management of dyspareunia, and resumption of intercourse Discussed avoiding interpregnancy interval <51mths and recommended birth spacing of 18 months  4. Sleep and fatigue Discussed coping options for fatigue and sleep disruption Encouraged family/partner/community support of 4 hrs of uninterrupted sleep to help with mood and fatigue  5. Physical recovery  If pt had a C/S, assessed incisional pain and providing guidance on normal vs  prolonged recovery If pt had a laceration, perineal healing and pain reviewed.  If urinary or fecal incontinence, discussed management and referred to PT or uro/gyn if indicated  Patient is safe to resume physical activity. Discussed attainment of healthy weight.  6.  Chronic disease management Discussed pregnancy complications if any, and their implications for future childbearing and long-term maternal health. Review recommendations for prevention of recurrent pregnancy complications, such as aspirin to reduce risk of preeclampsia yes. Pt had GDM: No. If yes, 2hr GTT scheduled: not applicable. Reviewed medications and non-pregnant dosing including consideration of whether pt is breastfeeding using a reliable resource such as LactMed: not applicable Referred for f/u w/ PCP or subspecialist providers as indicated: not applicable  7. Health maintenance Mammogram at 23yo or earlier if indicated Pap smears as indicated  Meds:  Meds ordered this encounter  Medications   metoCLOPramide (REGLAN) 10 MG tablet  Sig: Take 1 tablet (10 mg total) by mouth 3 (three) times daily.    Dispense:  90 tablet    Refill:  3    Supervising Provider:   Lazaro Arms [[id number]]    Follow-up: No follow-ups on file.   No orders of the defined types were placed in this encounter.      Jacklyn Shell DNP, CNM [facility] [employer], [facility] 04/28/2023 10:20 AM

## 2023-04-29 LAB — CERVICOVAGINAL ANCILLARY ONLY
Bacterial Vaginitis (gardnerella): NEGATIVE
Candida Glabrata: NEGATIVE
Candida Vaginitis: NEGATIVE
Comment: NEGATIVE
Comment: NEGATIVE
Comment: NEGATIVE
Comment: NEGATIVE
Trichomonas: NEGATIVE

## 2023-08-08 ENCOUNTER — Emergency Department (HOSPITAL_COMMUNITY)
Admission: EM | Admit: 2023-08-08 | Discharge: 2023-08-09 | Disposition: A | Discharge status: Home / Self Care | Attending: Emergency Medicine | Admitting: Emergency Medicine

## 2023-08-08 ENCOUNTER — Encounter (HOSPITAL_COMMUNITY): Payer: Self-pay

## 2023-08-08 ENCOUNTER — Other Ambulatory Visit: Payer: Self-pay

## 2023-08-08 DIAGNOSIS — Z91040 Latex allergy status: Secondary | ICD-10-CM | POA: Insufficient documentation

## 2023-08-08 DIAGNOSIS — R519 Headache, unspecified: Secondary | ICD-10-CM | POA: Diagnosis present

## 2023-08-08 DIAGNOSIS — G43009 Migraine without aura, not intractable, without status migrainosus: Secondary | ICD-10-CM | POA: Diagnosis not present

## 2023-08-08 MED ORDER — METOCLOPRAMIDE HCL 10 MG PO TABS: 10.0000 mg | ORAL_TABLET | Freq: Once | ORAL | Status: DC

## 2023-08-08 MED ORDER — ACETAMINOPHEN 500 MG PO TABS
1000.0000 mg | ORAL_TABLET | Freq: Once | ORAL | Status: AC
  Administered 2023-08-09: 1000 mg via ORAL
  Filled 2023-08-08: qty 2

## 2023-08-08 MED ORDER — IBUPROFEN 400 MG PO TABS: 600.0000 mg | ORAL_TABLET | Freq: Once | ORAL | Status: DC

## 2023-08-08 MED ORDER — METOCLOPRAMIDE HCL 5 MG/ML IJ SOLN
10.0000 mg | Freq: Once | INTRAMUSCULAR | Status: AC
  Administered 2023-08-09: 10 mg via INTRAVENOUS
  Filled 2023-08-08: qty 2

## 2023-08-08 MED ORDER — KETOROLAC TROMETHAMINE 15 MG/ML IJ SOLN
15.0000 mg | Freq: Once | INTRAMUSCULAR | Status: AC
  Administered 2023-08-09: 15 mg via INTRAVENOUS
  Filled 2023-08-08: qty 1

## 2023-08-08 MED ORDER — DIPHENHYDRAMINE HCL 50 MG/ML IJ SOLN
25.0000 mg | Freq: Once | INTRAMUSCULAR | Status: AC
  Administered 2023-08-09: 25 mg via INTRAVENOUS
  Filled 2023-08-08: qty 1

## 2023-08-08 MED ORDER — DIPHENHYDRAMINE HCL 25 MG PO CAPS: 25.0000 mg | ORAL_CAPSULE | Freq: Once | ORAL | Status: DC

## 2023-08-08 MED ORDER — LACTATED RINGERS IV BOLUS
1000.0000 mL | Freq: Once | INTRAVENOUS | Status: AC
Start: 2023-08-09 — End: 2023-08-09
  Administered 2023-08-09: 1000 mL via INTRAVENOUS

## 2023-08-08 NOTE — ED Provider Notes (Signed)
 [place] EMERGENCY DEPARTMENT AT [facility] Provider Note   CSN: [id number] Arrival date & time: 08/08/23  2129     History  Chief Complaint  Patient presents with   Hypertension    70mo PP   Headache    Norma Aguilar is a 23 y.o. female with PMH as listed below who presents Pov from home. Cc of high blood pressure. 158/122 at home. Says she had blood pressure issues while pregnant but at her 6week visit they said her blood pressure was ok and didn't need any medications. She is 4 months from delivery.  She presents to ED with two days of gradual onset headache that is the same as a headache she has had in the past but worse.  Previously it went away but this one has not. A/w photophobia. It is throbbing and constant, located behind her right eye, currently rated 7/10. A/w some blurry vision. Improves when she lies down in a dark room. A/w nausea as well but no vomiting. No f/c, neck stiffness, increase in intensity with bending over or valsalva.  She is not currently breastfeeding. No sick contacts or other family members with headache.   Past Medical History:  Diagnosis Date   ADHD (attention deficit hyperactivity disorder)    ADHD (attention deficit hyperactivity disorder)    Anxiety    Oppositional defiant disorder    Pregnancy induced hypertension        Home Medications Prior to Admission medications   Medication Sig Start Date End Date Taking? Authorizing Provider  ibuprofen (ADVIL) 800 MG tablet Take 1 tablet (800 mg total) by mouth every 8 (eight) hours as needed. 03/27/23   Arabella Merles, CNM  metoCLOPramide (REGLAN) 10 MG tablet Take 1 tablet (10 mg total) by mouth 3 (three) times daily. 04/28/23   Cresenzo-Dishmon, Scarlette Calico, CNM  NIFEdipine (ADALAT CC) 30 MG 24 hr tablet Take 1 tablet (30 mg total) by mouth daily. 03/27/23   Arabella Merles, CNM  Prenat w/o A-FeCbGl-DSS-FA-DHA (CITRANATAL 90 DHA) 90-1 & 300 MG MISC Take as directed 07/18/22   Adline Potter, NP      Allergies    Phenergan [promethazine hcl], Latex, and Lexapro [escitalopram oxalate]    Review of Systems   Review of Systems A 10 point review of systems was performed and is negative unless otherwise reported in HPI.  Physical Exam Updated Vital Signs BP 111/76   Pulse (!) 107   Temp 97.9 F (36.6 C) (Oral)   Resp 18   Ht 5\' 1"  (1.549 m)   Wt 83.9 kg   LMP 08/07/2023   SpO2 100%   BMI 34.96 kg/m  Physical Exam General: Normal appearing female, lying in bed.  HEENT: PERRLA, EOMI, no nystagmus, Sclera anicteric, MMM, trachea midline.  Cardiology: RRR, no murmurs/rubs/gallops.  Resp: Normal respiratory rate and effort. CTAB, no wheezes, rhonchi, crackles.  Abd: Soft, non-tender, non-distended. No rebound tenderness or guarding.  GU: Deferred. MSK: No peripheral edema or signs of trauma.  Skin: warm, dry.  Neuro: A&Ox4, CNs II-XII grossly intact. MAEs. Sensation grossly intact. Normal speech.  Psych: Normal mood and affect.   ED Results / Procedures / Treatments   Labs (all labs ordered are listed, but only abnormal results are displayed) Labs Reviewed - No data to display  EKG None  Radiology No results found.  Procedures Procedures    Medications Ordered in ED Medications - No data to display  ED Course/ Medical Decision Making/ A&P  Medical Decision Making Amount and/or Complexity of Data Reviewed Labs:  Decision-making details documented in ED Course.  Risk OTC drugs. Prescription drug management.    This patient presents to the ED for concern of R sided headache, this involves an extensive number of treatment options, and is a complaint that carries with it a high risk of complications and morbidity.  I considered the following differential and admission for this acute, potentially life threatening condition. Pt is uncomfortable but overall HDS and well-appearing.  MDM:    Patient is overall very  well-appearing, HDS, non-toxic, afebrile.   Unlikely SAH: headache is non thunderclap Unlikely Subdural/epidural hematoma: no history of trauma, no anticoagulation. Unlikely Meningitis: afebrile, no meningismus. Unlikely Temporal arteritis: pt < 27 years old.  no tenderness in temporal area Unlikely Acute angle glaucoma: PERRLA, no eye pain. Unlikely Carbon Monoxide Poisoning: no other house members with similar symptoms. Unlikely pre-eclampsia: patient is 4 months out from delivery [facility] emergency: Patient's BP here is very reassuring, and she has no FNDs Unlikely IIH: pain improves with lying down, doesn't worsen w/ valsalva/bending over   Most likely 2/2  migraine, or headache of non-emergent etiology. Plan: Will give headache cocktail and reexamine.   No focal neurological symptoms. Neuro exam is benign. Headache is gradual, non-maximal at onset and similar to headaches in the past. Because patient notes the headache is worse than before I offered her more advanced imaging such as CTH, but after shared decision making patient would like to hold off on the Cth and attempt headache cocktail first.  Clinical Course as of 08/13/23 0455  Sat Aug 09, 2023  0016 Preg Test, Ur: Negative [HN]    Clinical Course User Index [HN] Loetta Rough, MD    Labs: Preg test ordered from triage  Additional history obtained from chart review, father at bedside.    Reevaluation: After the interventions noted above, I reevaluated the patient and found that they have :resolved  Social Determinants of Health: Lives independently   Disposition:  after headache cocktail, patient's headache resolves, Pain 0/10 and well-appearing. Advised to stay hydrated, alterate tylenol/ibuprofen if headache returns, and to keep a HA diary and f/u with PCP wthin 1-2 weeks. DC w/ discharge instructions/return precautions. All questions answered to patient's satisfaction.    Co morbidities that complicate  the patient evaluation  Past Medical History:  Diagnosis Date   ADHD (attention deficit hyperactivity disorder)    ADHD (attention deficit hyperactivity disorder)    Anxiety    Oppositional defiant disorder    Pregnancy induced hypertension      Medicines No orders of the defined types were placed in this encounter.   I have reviewed the patients home medicines and have made adjustments as needed  Problem List / ED Course: Problem List Items Addressed This Visit   None Visit Diagnoses       Migraine without aura and without status migrainosus, not intractable    -  Primary   Relevant Medications   acetaminophen (TYLENOL) tablet 1,000 mg (Completed)   ketorolac (TORADOL) 15 MG/ML injection 15 mg (Completed)                   This note was created using dictation software, which may contain spelling or grammatical errors.    Loetta Rough, MD 08/13/23 [phone]

## 2023-08-08 NOTE — ED Triage Notes (Signed)
 Pov from home. Cc of high blood pressure. 158/122 at home. Says she had blood pressure issues while pregnant but at her 6week visit they said her blood pressure was ok and didn't need any medications.  Today she says that her head is hurting and eyes hurt. 10/10 says the headache is pounding and makes her nauseas.

## 2023-08-09 LAB — POC URINE PREG, ED: Preg Test, Ur: NEGATIVE

## 2023-08-09 NOTE — ED Notes (Signed)
Pt requesting preg test.

## 2023-08-09 NOTE — Discharge Instructions (Signed)
 Thank you for coming to [facility] Emergency Department. You were seen for headache. We did an exam and your headache improved with medication. Please follow up with your primary care provider within 1 week as needed or if symptoms worsen.   Do not hesitate to return to the ED or call 911 if you experience: -Worsening symptoms -Nausea/vomiting so severe that you cannot eat/drink anything -Numbness/tingling, asymmetric weakness -Visual changes -Lightheadedness, passing out -Fevers/chills -Anything else that concerns you

## 2023-12-08 ENCOUNTER — Telehealth: Payer: Self-pay | Admitting: Physician Assistant

## 2023-12-08 DIAGNOSIS — F419 Anxiety disorder, unspecified: Secondary | ICD-10-CM

## 2023-12-08 NOTE — Patient Instructions (Signed)
  Norma  Aguilar, thank you for joining [name], PA-C for today's virtual visit.  While this provider is not your primary care provider (PCP), if your PCP is located in our provider database this encounter information will be shared with them immediately following your visit.   A [facility] MyChart account gives you access to today's visit and all your visits, tests, and labs performed at [facility]  click here if you don't have a [facility] MyChart account or go to [email or web address]  Consent: (Patient) Norma  Aguilar provided verbal consent for this virtual visit at the beginning of the encounter.  Current Medications:  Current Outpatient Medications:    ibuprofen  (ADVIL ) 800 MG tablet, Take 1 tablet (800 mg total) by mouth every 8 (eight) hours as needed., Disp: 30 tablet, Rfl: 0   metoCLOPramide  (REGLAN ) 10 MG tablet, Take 1 tablet (10 mg total) by mouth 3 (three) times daily., Disp: 90 tablet, Rfl: 3   NIFEdipine  (ADALAT  CC) 30 MG 24 hr tablet, Take 1 tablet (30 mg total) by mouth daily., Disp: 30 tablet, Rfl: 1   Prenat w/o A-FeCbGl-DSS-FA-DHA (CITRANATAL  90 DHA) 90-1 & 300 MG MISC, Take as directed, Disp: 60 each, Rfl: 12   Medications ordered in this encounter:  No orders of the defined types were placed in this encounter.    *If you need refills on other medications prior to your next appointment, please contact your pharmacy*  Follow-Up: Call back or seek an in-person evaluation if the symptoms worsen or if the condition fails to improve as anticipated.  [facility] [phone]  Other Instructions [facility] [address] [place], [place] [address] [phone]   If you have been instructed to have an in-person evaluation today at a local Urgent Care facility, please use the link below. It will take you to a list of all of our available [facility], including address, phone number and hours of  operation. Please do not delay care.  [facility]  If you or a family member do not have a primary care provider, use the link below to schedule a visit and establish care. When you choose a [facility] primary care physician or advanced practice provider, you gain a long-term partner in health. Find a Primary Care Provider  Learn more about [facility] in-office and virtual care options: [facility] - Get Care Now

## 2023-12-08 NOTE — Progress Notes (Signed)
 Virtual Visit Consent   Zen  Norma Aguilar, you are scheduled for a virtual visit with a [facility] provider today. Just as with appointments in the office, your consent must be obtained to participate. Your consent will be active for this visit and any virtual visit you may have with one of our providers in the next 365 days. If you have a MyChart account, a copy of this consent can be sent to you electronically.  As this is a virtual visit, video technology does not allow for your provider to perform a traditional examination. This may limit your provider's ability to fully assess your condition. If your provider identifies any concerns that need to be evaluated in person or the need to arrange testing (such as labs, EKG, etc.), we will make arrangements to do so. Although advances in technology are sophisticated, we cannot ensure that it will always work on either your end or our end. If the connection with a video visit is poor, the visit may have to be switched to a telephone visit. With either a video or telephone visit, we are not always able to ensure that we have a secure connection.  By engaging in this virtual visit, you consent to the provision of healthcare and authorize for your insurance to be billed (if applicable) for the services provided during this visit. Depending on your insurance coverage, you may receive a charge related to this service.  I need to obtain your verbal consent now. Are you willing to proceed with your visit today? Norma  Aguilar has provided verbal consent on 12/08/2023 for a virtual visit (video or telephone). Norma Aguilar, [place]  Date: 12/08/2023 1:47 PM   Virtual Visit via Video Note   I, Norma Aguilar, connected with  Norma  Aguilar  ([id number], [birth year]) on 12/08/23 at  1:45 PM EDT by a video-enabled telemedicine application and verified that I am speaking with the correct person using two identifiers.  Location: Patient: Virtual Visit Location Patient:  Home Provider: Virtual Visit Location Provider: Home Office   I discussed the limitations of evaluation and management by telemedicine and the availability of in person appointments. The patient expressed understanding and agreed to proceed.    History of Present Illness: Norma  Aguilar is a 23 y.o. who identifies as a female who was assigned female at birth, and is being seen today for anxiety. SABRA  HPI: Anxiety Presents for initial visit. Onset was 1 to 4 weeks ago. The problem has been waxing and waning. Symptoms include excessive worry and nervous/anxious behavior. Symptoms occur most days. The severity of symptoms is mild. Nothing aggravates the symptoms.   Compliance with prior treatments has been poor.    Problems:  Patient Active Problem List   Diagnosis Date Noted   SVD (spontaneous vaginal delivery) 03/26/2023   PCOS (polycystic ovarian syndrome) 01/16/2023   Gestational hypertension 12/30/2022   Supervision of high risk pregnancy, antepartum 10/01/2022   Hirsutism 07/18/2022   Suicidal ideation 05/02/2017   Panic attacks 05/01/2017   Abnormal biliary HIDA scan 02/04/2017   Heartburn 02/04/2017   Defecation urgency 02/04/2017   Major depressive disorder, recurrent episode (HCC) 01/12/2014   ADHD (attention deficit hyperactivity disorder), combined type 08/05/2011   Oppositional defiant disorder 08/05/2011   Generalized anxiety disorder 08/05/2011    Allergies:  Allergies  Allergen Reactions   Phenergan [Promethazine Hcl]    Latex Itching and Rash   Lexapro  [Escitalopram  Oxalate] Rash   Medications:  Current Outpatient Medications:    ibuprofen  (ADVIL ) 800 MG  tablet, Take 1 tablet (800 mg total) by mouth every 8 (eight) hours as needed., Disp: 30 tablet, Rfl: 0   metoCLOPramide  (REGLAN ) 10 MG tablet, Take 1 tablet (10 mg total) by mouth 3 (three) times daily., Disp: 90 tablet, Rfl: 3   NIFEdipine  (ADALAT  CC) 30 MG 24 hr tablet, Take 1 tablet (30 mg total) by mouth  daily., Disp: 30 tablet, Rfl: 1   Prenat w/o [id number] (CITRANATAL  90 DHA) 90-1 & 300 MG MISC, Take as directed, Disp: 60 each, Rfl: 12  Observations/Objective: Patient is well-developed, well-nourished in no acute distress.  Resting comfortably  at home.  Head is normocephalic, atraumatic.  No labored breathing.  Speech is clear and coherent with logical content.  Patient is alert and oriented at baseline.    Assessment and Plan: 1. Anxiety (Primary)  Denies SI/HI. Patient with ongoing anxiety. States she has been off medication for quite sometime. I offered Hydroxyzine  in the interim which she declined. Advised patient to present to the [facility] for care coordination and med management. She agreed to this plan. She was not toxic or acutely ill during this video visit.   Follow Up Instructions: I discussed the assessment and treatment plan with the patient. The patient was provided an opportunity to ask questions and all were answered. The patient agreed with the plan and demonstrated an understanding of the instructions.  A copy of instructions were sent to the patient via MyChart unless otherwise noted below.     The patient was advised to call back or seek an in-person evaluation if the symptoms worsen or if the condition fails to improve as anticipated.    Norma Shuck, PA-C

## 2024-06-21 ENCOUNTER — Ambulatory Visit: Payer: Self-pay | Admitting: Medical

## 2024-07-08 ENCOUNTER — Ambulatory Visit: Payer: Self-pay | Admitting: Medical
